# Patient Record
Sex: Male | Born: 1945 | Race: White | Hispanic: No | Marital: Married | Smoking: Former smoker
Health system: Southern US, Community
[De-identification: ages and names within clinical notes are randomized; demographics above are authoritative.]

## PROBLEM LIST (undated history)

## (undated) DIAGNOSIS — R945 Abnormal results of liver function studies: Secondary | ICD-10-CM

## (undated) DIAGNOSIS — E1140 Type 2 diabetes mellitus with diabetic neuropathy, unspecified: Secondary | ICD-10-CM

## (undated) DIAGNOSIS — Z947 Corneal transplant status: Secondary | ICD-10-CM

## (undated) DIAGNOSIS — C439 Malignant melanoma of skin, unspecified: Secondary | ICD-10-CM

## (undated) DIAGNOSIS — N529 Male erectile dysfunction, unspecified: Secondary | ICD-10-CM

## (undated) DIAGNOSIS — E119 Type 2 diabetes mellitus without complications: Secondary | ICD-10-CM

## (undated) DIAGNOSIS — I5032 Chronic diastolic (congestive) heart failure: Secondary | ICD-10-CM

## (undated) DIAGNOSIS — E291 Testicular hypofunction: Secondary | ICD-10-CM

## (undated) DIAGNOSIS — F32A Depression, unspecified: Secondary | ICD-10-CM

## (undated) DIAGNOSIS — G319 Degenerative disease of nervous system, unspecified: Secondary | ICD-10-CM

## (undated) DIAGNOSIS — E785 Hyperlipidemia, unspecified: Secondary | ICD-10-CM

## (undated) DIAGNOSIS — G4730 Sleep apnea, unspecified: Secondary | ICD-10-CM

## (undated) DIAGNOSIS — I10 Essential (primary) hypertension: Secondary | ICD-10-CM

## (undated) DIAGNOSIS — C801 Malignant (primary) neoplasm, unspecified: Secondary | ICD-10-CM

## (undated) DIAGNOSIS — I700 Atherosclerosis of aorta: Secondary | ICD-10-CM

## (undated) DIAGNOSIS — N401 Enlarged prostate with lower urinary tract symptoms: Secondary | ICD-10-CM

## (undated) DIAGNOSIS — N1831 Chronic kidney disease, stage 3a: Secondary | ICD-10-CM

## (undated) DIAGNOSIS — I831 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: Secondary | ICD-10-CM

## (undated) DIAGNOSIS — R413 Other amnesia: Secondary | ICD-10-CM

## (undated) DIAGNOSIS — M1990 Unspecified osteoarthritis, unspecified site: Secondary | ICD-10-CM

## (undated) DIAGNOSIS — I6523 Occlusion and stenosis of bilateral carotid arteries: Secondary | ICD-10-CM

## (undated) HISTORY — DX: Occlusion and stenosis of bilateral carotid arteries: I65.23

## (undated) HISTORY — DX: Type 2 diabetes mellitus with diabetic neuropathy, unspecified: E11.40

## (undated) HISTORY — DX: Benign prostatic hyperplasia with lower urinary tract symptoms: N40.1

## (undated) HISTORY — DX: Male erectile dysfunction, unspecified: N52.9

## (undated) HISTORY — DX: Atherosclerosis of aorta: I70.0

## (undated) HISTORY — DX: Hyperlipidemia, unspecified: E78.5

## (undated) HISTORY — DX: Chronic diastolic (congestive) heart failure: I50.32

## (undated) HISTORY — DX: Malignant melanoma of skin, unspecified: C43.9

## (undated) HISTORY — DX: Morbid (severe) obesity due to excess calories: E66.01

## (undated) HISTORY — DX: Chronic kidney disease, stage 3a: N18.31

## (undated) HISTORY — DX: Abnormal results of liver function studies: R94.5

## (undated) HISTORY — DX: Depression, unspecified: F32.A

## (undated) HISTORY — DX: Other amnesia: R41.3

## (undated) HISTORY — DX: Testicular hypofunction: E29.1

## (undated) HISTORY — DX: Sleep apnea, unspecified: G47.30

## (undated) HISTORY — DX: Essential (primary) hypertension: I10

## (undated) HISTORY — DX: 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: I83.10

## (undated) HISTORY — PX: OTHER SURGICAL HISTORY: SHX169

## (undated) HISTORY — DX: Corneal transplant status: Z94.7

## (undated) HISTORY — DX: Degenerative disease of nervous system, unspecified: G31.9

---

## 2012-05-03 DIAGNOSIS — N401 Enlarged prostate with lower urinary tract symptoms: Secondary | ICD-10-CM

## 2012-05-03 HISTORY — DX: Benign prostatic hyperplasia with lower urinary tract symptoms: N40.1

## 2012-05-17 ENCOUNTER — Other Ambulatory Visit (HOSPITAL_COMMUNITY): Payer: Self-pay

## 2012-05-18 ENCOUNTER — Other Ambulatory Visit (HOSPITAL_COMMUNITY): Payer: Self-pay

## 2012-05-25 ENCOUNTER — Ambulatory Visit (HOSPITAL_COMMUNITY): Payer: Medicare HMO | Attending: Cardiology | Admitting: Radiology

## 2012-05-25 ENCOUNTER — Other Ambulatory Visit (HOSPITAL_COMMUNITY): Payer: Self-pay | Admitting: Endocrinology

## 2012-05-25 DIAGNOSIS — E119 Type 2 diabetes mellitus without complications: Secondary | ICD-10-CM | POA: Insufficient documentation

## 2012-05-25 DIAGNOSIS — R609 Edema, unspecified: Secondary | ICD-10-CM

## 2012-05-25 DIAGNOSIS — I10 Essential (primary) hypertension: Secondary | ICD-10-CM | POA: Insufficient documentation

## 2012-05-25 NOTE — Progress Notes (Signed)
Echocardiogram performed.  

## 2012-05-26 ENCOUNTER — Encounter (HOSPITAL_COMMUNITY): Payer: Self-pay | Admitting: Endocrinology

## 2014-07-24 ENCOUNTER — Other Ambulatory Visit: Payer: Self-pay | Admitting: Gastroenterology

## 2014-07-24 DIAGNOSIS — E119 Type 2 diabetes mellitus without complications: Secondary | ICD-10-CM | POA: Diagnosis not present

## 2014-07-24 DIAGNOSIS — Z1211 Encounter for screening for malignant neoplasm of colon: Secondary | ICD-10-CM | POA: Diagnosis not present

## 2014-07-24 DIAGNOSIS — I10 Essential (primary) hypertension: Secondary | ICD-10-CM | POA: Diagnosis not present

## 2014-07-24 DIAGNOSIS — E6609 Other obesity due to excess calories: Secondary | ICD-10-CM | POA: Diagnosis not present

## 2014-07-24 DIAGNOSIS — R740 Nonspecific elevation of levels of transaminase and lactic acid dehydrogenase [LDH]: Secondary | ICD-10-CM | POA: Diagnosis not present

## 2014-07-26 ENCOUNTER — Other Ambulatory Visit: Payer: Self-pay | Admitting: Gastroenterology

## 2014-07-26 DIAGNOSIS — R7989 Other specified abnormal findings of blood chemistry: Secondary | ICD-10-CM

## 2014-07-26 DIAGNOSIS — R945 Abnormal results of liver function studies: Principal | ICD-10-CM

## 2014-07-31 ENCOUNTER — Other Ambulatory Visit: Payer: Medicare HMO

## 2014-08-03 ENCOUNTER — Ambulatory Visit
Admission: RE | Admit: 2014-08-03 | Discharge: 2014-08-03 | Disposition: A | Discharge status: Home / Self Care | Payer: Commercial Managed Care - HMO | Source: Ambulatory Visit | Attending: Gastroenterology | Admitting: Gastroenterology

## 2014-08-03 DIAGNOSIS — R7989 Other specified abnormal findings of blood chemistry: Secondary | ICD-10-CM

## 2014-08-03 DIAGNOSIS — I10 Essential (primary) hypertension: Secondary | ICD-10-CM | POA: Diagnosis not present

## 2014-08-03 DIAGNOSIS — R945 Abnormal results of liver function studies: Principal | ICD-10-CM

## 2014-08-03 DIAGNOSIS — E119 Type 2 diabetes mellitus without complications: Secondary | ICD-10-CM | POA: Diagnosis not present

## 2014-08-15 DIAGNOSIS — Z1211 Encounter for screening for malignant neoplasm of colon: Secondary | ICD-10-CM | POA: Diagnosis not present

## 2014-08-15 DIAGNOSIS — K635 Polyp of colon: Secondary | ICD-10-CM | POA: Diagnosis not present

## 2014-08-15 DIAGNOSIS — K5730 Diverticulosis of large intestine without perforation or abscess without bleeding: Secondary | ICD-10-CM | POA: Diagnosis not present

## 2014-08-15 DIAGNOSIS — D123 Benign neoplasm of transverse colon: Secondary | ICD-10-CM | POA: Diagnosis not present

## 2014-08-15 DIAGNOSIS — D122 Benign neoplasm of ascending colon: Secondary | ICD-10-CM | POA: Diagnosis not present

## 2014-09-27 DIAGNOSIS — Z947 Corneal transplant status: Secondary | ICD-10-CM | POA: Diagnosis not present

## 2014-10-24 DIAGNOSIS — N401 Enlarged prostate with lower urinary tract symptoms: Secondary | ICD-10-CM | POA: Diagnosis not present

## 2014-10-24 DIAGNOSIS — E119 Type 2 diabetes mellitus without complications: Secondary | ICD-10-CM | POA: Diagnosis not present

## 2014-10-24 DIAGNOSIS — I10 Essential (primary) hypertension: Secondary | ICD-10-CM | POA: Diagnosis not present

## 2014-10-24 DIAGNOSIS — G4730 Sleep apnea, unspecified: Secondary | ICD-10-CM | POA: Diagnosis not present

## 2014-10-24 DIAGNOSIS — Z6841 Body Mass Index (BMI) 40.0 and over, adult: Secondary | ICD-10-CM | POA: Diagnosis not present

## 2014-10-24 DIAGNOSIS — E291 Testicular hypofunction: Secondary | ICD-10-CM | POA: Diagnosis not present

## 2014-10-24 DIAGNOSIS — E785 Hyperlipidemia, unspecified: Secondary | ICD-10-CM | POA: Diagnosis not present

## 2014-11-23 DIAGNOSIS — D1801 Hemangioma of skin and subcutaneous tissue: Secondary | ICD-10-CM | POA: Diagnosis not present

## 2014-11-23 DIAGNOSIS — C44319 Basal cell carcinoma of skin of other parts of face: Secondary | ICD-10-CM | POA: Diagnosis not present

## 2014-11-23 DIAGNOSIS — C4359 Malignant melanoma of other part of trunk: Secondary | ICD-10-CM | POA: Diagnosis not present

## 2014-11-23 DIAGNOSIS — C44519 Basal cell carcinoma of skin of other part of trunk: Secondary | ICD-10-CM | POA: Diagnosis not present

## 2014-11-23 DIAGNOSIS — D485 Neoplasm of uncertain behavior of skin: Secondary | ICD-10-CM | POA: Diagnosis not present

## 2014-11-23 DIAGNOSIS — C4441 Basal cell carcinoma of skin of scalp and neck: Secondary | ICD-10-CM | POA: Diagnosis not present

## 2014-12-19 DIAGNOSIS — C4359 Malignant melanoma of other part of trunk: Secondary | ICD-10-CM | POA: Diagnosis not present

## 2014-12-19 DIAGNOSIS — L905 Scar conditions and fibrosis of skin: Secondary | ICD-10-CM | POA: Diagnosis not present

## 2014-12-19 DIAGNOSIS — Z85820 Personal history of malignant melanoma of skin: Secondary | ICD-10-CM | POA: Diagnosis not present

## 2015-01-02 DIAGNOSIS — Z85828 Personal history of other malignant neoplasm of skin: Secondary | ICD-10-CM | POA: Diagnosis not present

## 2015-01-02 DIAGNOSIS — C44319 Basal cell carcinoma of skin of other parts of face: Secondary | ICD-10-CM | POA: Diagnosis not present

## 2015-01-02 DIAGNOSIS — Z85820 Personal history of malignant melanoma of skin: Secondary | ICD-10-CM | POA: Diagnosis not present

## 2015-01-09 DIAGNOSIS — Z85820 Personal history of malignant melanoma of skin: Secondary | ICD-10-CM | POA: Diagnosis not present

## 2015-01-09 DIAGNOSIS — C4441 Basal cell carcinoma of skin of scalp and neck: Secondary | ICD-10-CM | POA: Diagnosis not present

## 2015-01-09 DIAGNOSIS — C44519 Basal cell carcinoma of skin of other part of trunk: Secondary | ICD-10-CM | POA: Diagnosis not present

## 2015-01-09 DIAGNOSIS — Z85828 Personal history of other malignant neoplasm of skin: Secondary | ICD-10-CM | POA: Diagnosis not present

## 2015-01-09 DIAGNOSIS — L0889 Other specified local infections of the skin and subcutaneous tissue: Secondary | ICD-10-CM | POA: Diagnosis not present

## 2015-01-26 DIAGNOSIS — Z947 Corneal transplant status: Secondary | ICD-10-CM | POA: Diagnosis not present

## 2015-01-26 DIAGNOSIS — Z961 Presence of intraocular lens: Secondary | ICD-10-CM | POA: Diagnosis not present

## 2015-02-26 DIAGNOSIS — N401 Enlarged prostate with lower urinary tract symptoms: Secondary | ICD-10-CM | POA: Diagnosis not present

## 2015-02-26 DIAGNOSIS — Z947 Corneal transplant status: Secondary | ICD-10-CM | POA: Diagnosis not present

## 2015-02-26 DIAGNOSIS — I10 Essential (primary) hypertension: Secondary | ICD-10-CM | POA: Diagnosis not present

## 2015-02-26 DIAGNOSIS — E785 Hyperlipidemia, unspecified: Secondary | ICD-10-CM | POA: Diagnosis not present

## 2015-02-26 DIAGNOSIS — Z6841 Body Mass Index (BMI) 40.0 and over, adult: Secondary | ICD-10-CM | POA: Diagnosis not present

## 2015-02-26 DIAGNOSIS — E119 Type 2 diabetes mellitus without complications: Secondary | ICD-10-CM | POA: Diagnosis not present

## 2015-02-26 DIAGNOSIS — G4730 Sleep apnea, unspecified: Secondary | ICD-10-CM | POA: Diagnosis not present

## 2015-02-26 DIAGNOSIS — Z1389 Encounter for screening for other disorder: Secondary | ICD-10-CM | POA: Diagnosis not present

## 2015-04-10 DIAGNOSIS — L570 Actinic keratosis: Secondary | ICD-10-CM | POA: Diagnosis not present

## 2015-04-10 DIAGNOSIS — L918 Other hypertrophic disorders of the skin: Secondary | ICD-10-CM | POA: Diagnosis not present

## 2015-04-10 DIAGNOSIS — Z85820 Personal history of malignant melanoma of skin: Secondary | ICD-10-CM | POA: Diagnosis not present

## 2015-04-10 DIAGNOSIS — D3617 Benign neoplasm of peripheral nerves and autonomic nervous system of trunk, unspecified: Secondary | ICD-10-CM | POA: Diagnosis not present

## 2015-04-10 DIAGNOSIS — L821 Other seborrheic keratosis: Secondary | ICD-10-CM | POA: Diagnosis not present

## 2015-06-26 DIAGNOSIS — E291 Testicular hypofunction: Secondary | ICD-10-CM | POA: Diagnosis not present

## 2015-06-26 DIAGNOSIS — N401 Enlarged prostate with lower urinary tract symptoms: Secondary | ICD-10-CM | POA: Diagnosis not present

## 2015-06-26 DIAGNOSIS — R945 Abnormal results of liver function studies: Secondary | ICD-10-CM | POA: Diagnosis not present

## 2015-06-26 DIAGNOSIS — E784 Other hyperlipidemia: Secondary | ICD-10-CM | POA: Diagnosis not present

## 2015-06-26 DIAGNOSIS — R000 Tachycardia, unspecified: Secondary | ICD-10-CM | POA: Diagnosis not present

## 2015-06-26 DIAGNOSIS — G4739 Other sleep apnea: Secondary | ICD-10-CM | POA: Diagnosis not present

## 2015-06-26 DIAGNOSIS — Z947 Corneal transplant status: Secondary | ICD-10-CM | POA: Diagnosis not present

## 2015-06-26 DIAGNOSIS — E119 Type 2 diabetes mellitus without complications: Secondary | ICD-10-CM | POA: Diagnosis not present

## 2015-06-26 DIAGNOSIS — I10 Essential (primary) hypertension: Secondary | ICD-10-CM | POA: Diagnosis not present

## 2015-11-14 DIAGNOSIS — G4739 Other sleep apnea: Secondary | ICD-10-CM | POA: Diagnosis not present

## 2015-11-14 DIAGNOSIS — N401 Enlarged prostate with lower urinary tract symptoms: Secondary | ICD-10-CM | POA: Diagnosis not present

## 2015-11-14 DIAGNOSIS — Z6841 Body Mass Index (BMI) 40.0 and over, adult: Secondary | ICD-10-CM | POA: Diagnosis not present

## 2015-11-14 DIAGNOSIS — E784 Other hyperlipidemia: Secondary | ICD-10-CM | POA: Diagnosis not present

## 2015-11-14 DIAGNOSIS — E119 Type 2 diabetes mellitus without complications: Secondary | ICD-10-CM | POA: Diagnosis not present

## 2015-11-14 DIAGNOSIS — Z1389 Encounter for screening for other disorder: Secondary | ICD-10-CM | POA: Diagnosis not present

## 2015-11-14 DIAGNOSIS — I10 Essential (primary) hypertension: Secondary | ICD-10-CM | POA: Diagnosis not present

## 2015-11-14 DIAGNOSIS — R945 Abnormal results of liver function studies: Secondary | ICD-10-CM | POA: Diagnosis not present

## 2016-02-12 IMAGING — US US ABDOMEN COMPLETE
1 series · 14 of 25 positions shown · non-contrast
Comparison: None.

CLINICAL DATA: Elevated liver function studies; history of diabetes
and hypertension

EXAM:
ULTRASOUND ABDOMEN COMPLETE

[Series 1: us abdomen complete · 0.50mm/px · 14 of 69 slices shown]
[im 1/69]
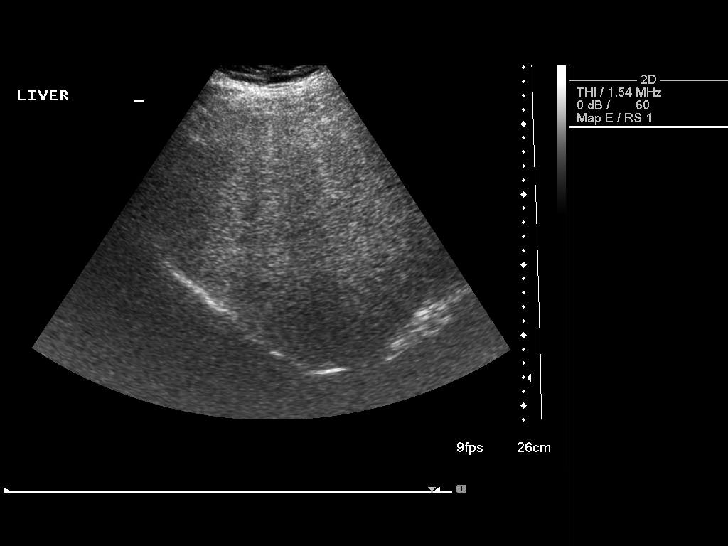
[im 6/69]
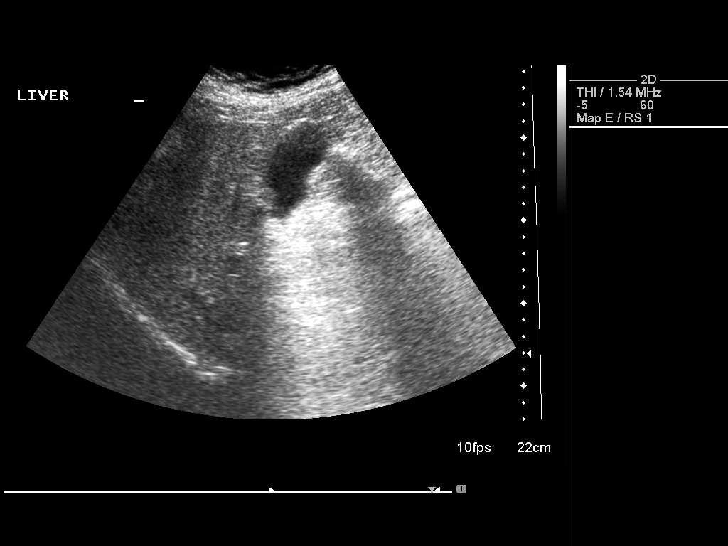
[im 12/69]
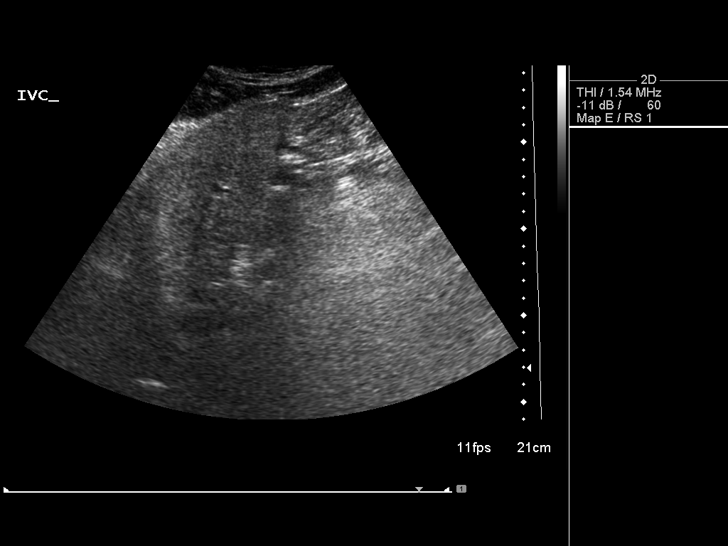
[im 18/69]
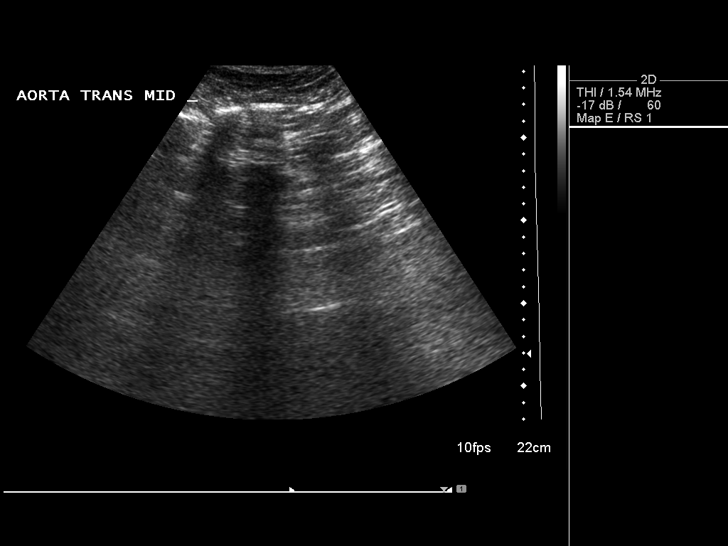
[im 23/69]
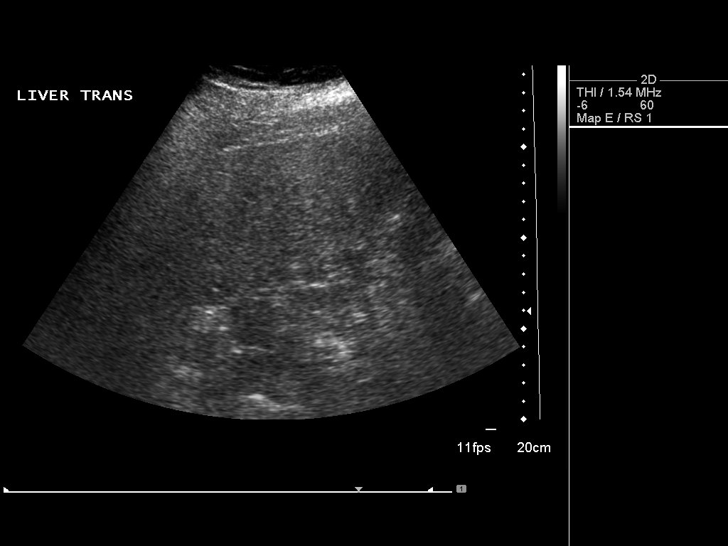
[im 26/69]
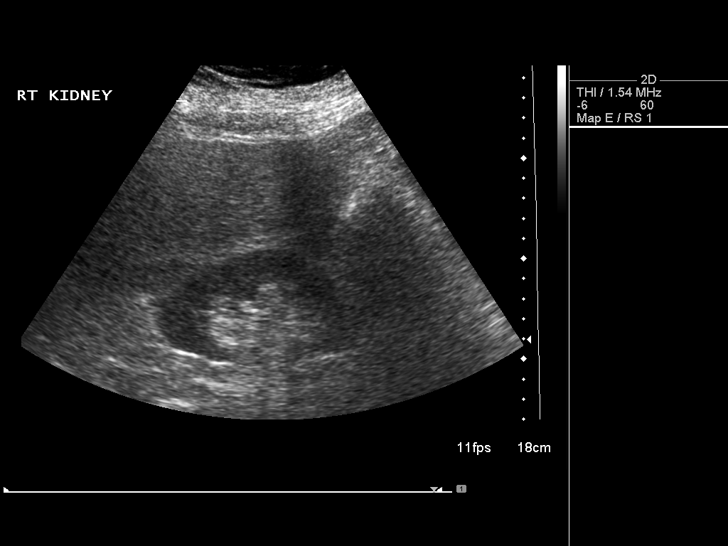
[im 32/69]
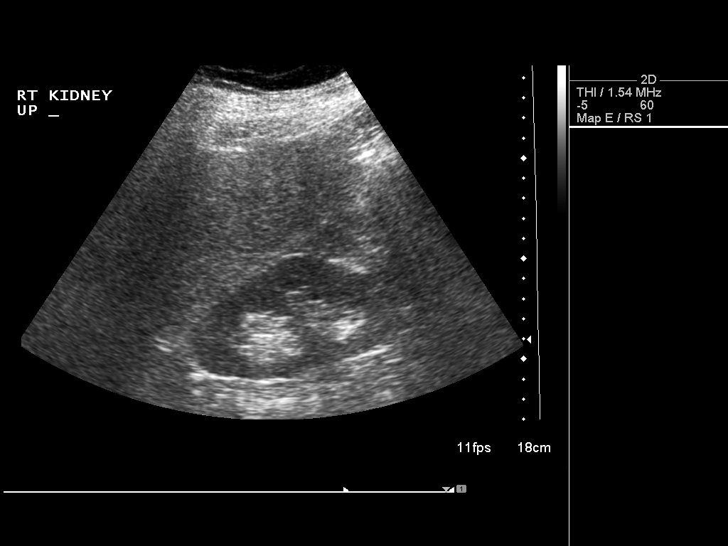
[im 37/69]
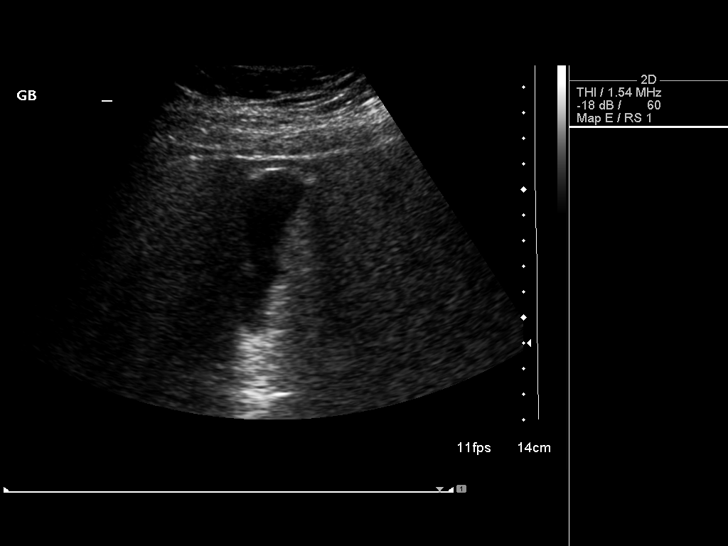
[im 43/69]
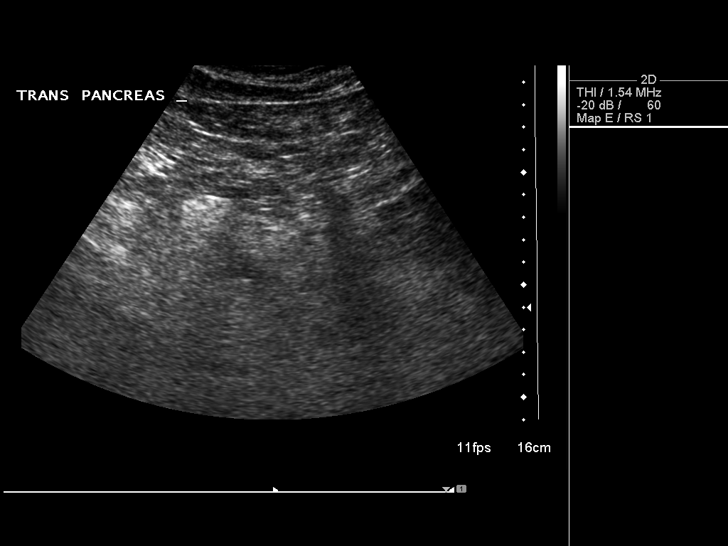
[im 46/69]
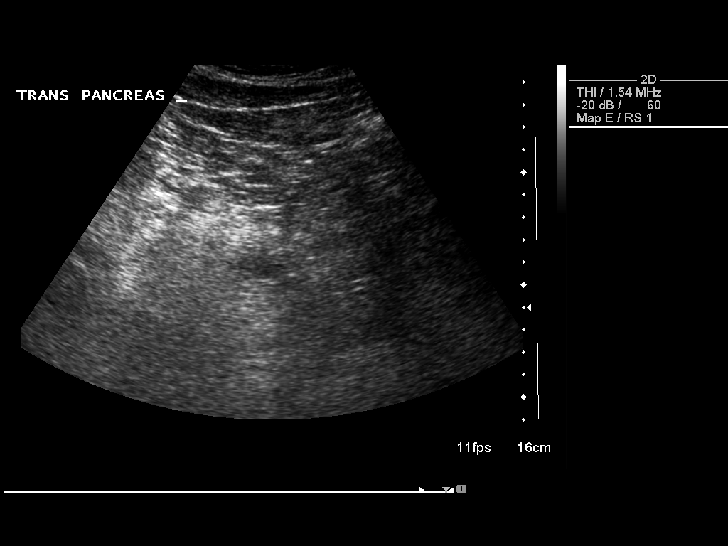
[im 52/69]
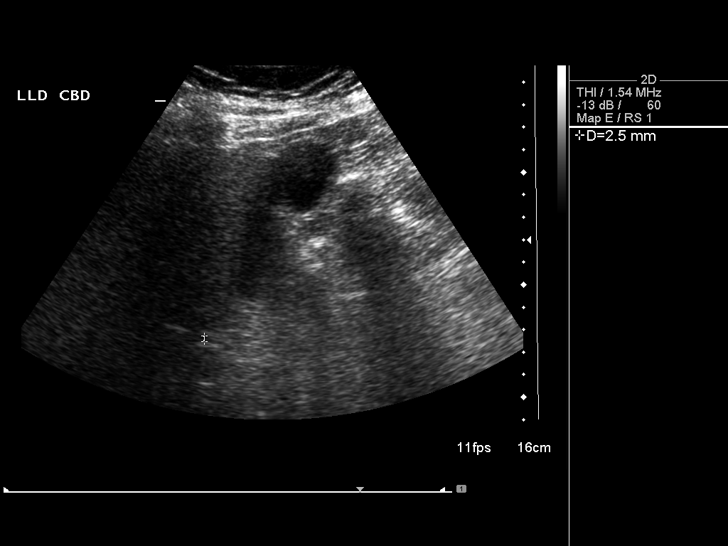
[im 57/69]
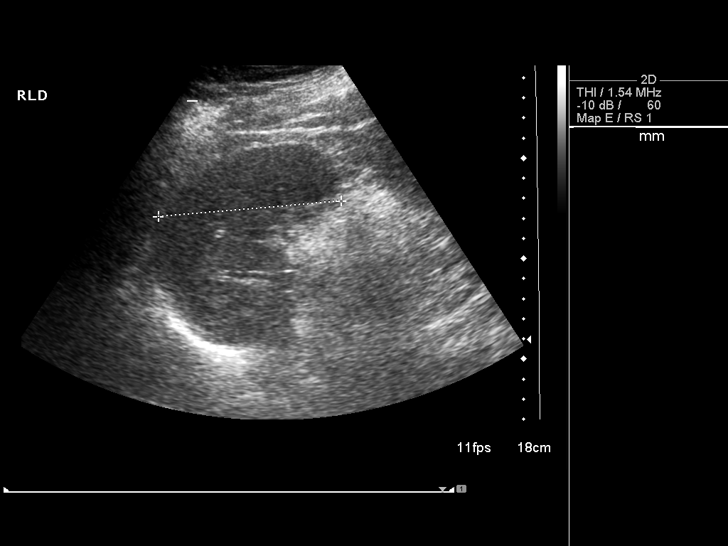
[im 63/69]
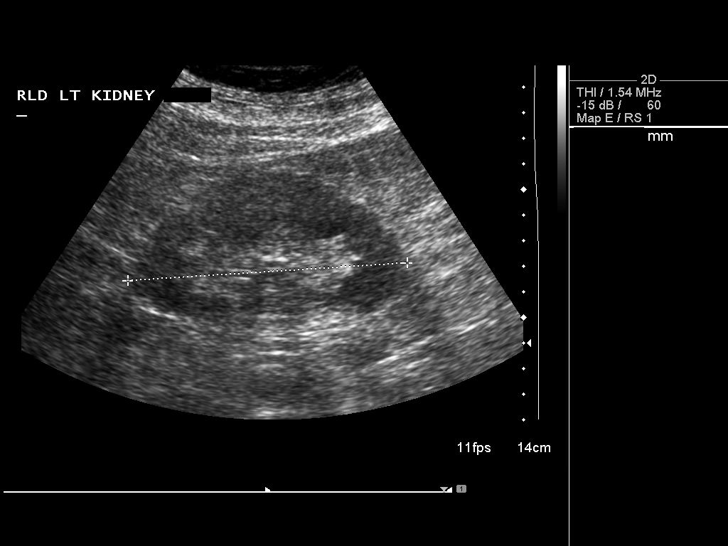
[im 69/69]
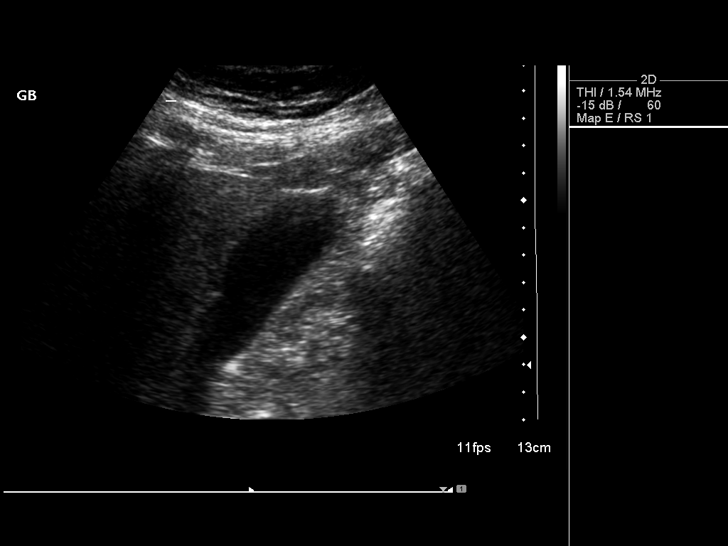

[14 of 25 positions shown; findings below may reference images not displayed]

FINDINGS: Gallbladder: No gallstones or wall thickening visualized. No
sonographic Murphy sign noted.

Common bile duct: Diameter: 3.8 mm

Liver: The hepatic echotexture is increased. There is no focal mass
or ductal dilation.

IVC: Bowel gas limits evaluation of the inferior vena cava.

Pancreas: Bowel gas limits evaluation of the pancreas.

Spleen: Size and appearance within normal limits.

Right Kidney: Length: 11.5 cm. Echogenicity within normal limits. No
mass or hydronephrosis visualized.

Left Kidney: Length: 11.0 cm. Echogenicity within normal limits. No
mass or hydronephrosis visualized.

Abdominal aorta: The abdominal aorta was largely obscured by bowel
gas.

Other findings: No ascites demonstrated.
IMPRESSION: The study is limited due to the patient's body habitus and bowel
gas. Increased hepatic echotexture is consistent with fatty
infiltrative change. No acute abnormality is demonstrated elsewhere.

## 2016-03-20 DIAGNOSIS — I10 Essential (primary) hypertension: Secondary | ICD-10-CM | POA: Diagnosis not present

## 2016-03-20 DIAGNOSIS — G4739 Other sleep apnea: Secondary | ICD-10-CM | POA: Diagnosis not present

## 2016-03-20 DIAGNOSIS — E784 Other hyperlipidemia: Secondary | ICD-10-CM | POA: Diagnosis not present

## 2016-03-20 DIAGNOSIS — N401 Enlarged prostate with lower urinary tract symptoms: Secondary | ICD-10-CM | POA: Diagnosis not present

## 2016-03-20 DIAGNOSIS — Z6841 Body Mass Index (BMI) 40.0 and over, adult: Secondary | ICD-10-CM | POA: Diagnosis not present

## 2016-03-20 DIAGNOSIS — Z947 Corneal transplant status: Secondary | ICD-10-CM | POA: Diagnosis not present

## 2016-03-20 DIAGNOSIS — E119 Type 2 diabetes mellitus without complications: Secondary | ICD-10-CM | POA: Diagnosis not present

## 2016-07-23 DIAGNOSIS — Z1389 Encounter for screening for other disorder: Secondary | ICD-10-CM | POA: Diagnosis not present

## 2016-07-23 DIAGNOSIS — Z947 Corneal transplant status: Secondary | ICD-10-CM | POA: Diagnosis not present

## 2016-07-23 DIAGNOSIS — I10 Essential (primary) hypertension: Secondary | ICD-10-CM | POA: Diagnosis not present

## 2016-07-23 DIAGNOSIS — G4739 Other sleep apnea: Secondary | ICD-10-CM | POA: Diagnosis not present

## 2016-07-23 DIAGNOSIS — E784 Other hyperlipidemia: Secondary | ICD-10-CM | POA: Diagnosis not present

## 2016-07-23 DIAGNOSIS — Z6841 Body Mass Index (BMI) 40.0 and over, adult: Secondary | ICD-10-CM | POA: Diagnosis not present

## 2016-07-23 DIAGNOSIS — E119 Type 2 diabetes mellitus without complications: Secondary | ICD-10-CM | POA: Diagnosis not present

## 2016-07-23 DIAGNOSIS — R945 Abnormal results of liver function studies: Secondary | ICD-10-CM | POA: Diagnosis not present

## 2016-07-23 DIAGNOSIS — N401 Enlarged prostate with lower urinary tract symptoms: Secondary | ICD-10-CM | POA: Diagnosis not present

## 2016-11-26 ENCOUNTER — Other Ambulatory Visit: Payer: Self-pay | Admitting: Endocrinology

## 2016-11-26 DIAGNOSIS — Z1389 Encounter for screening for other disorder: Secondary | ICD-10-CM | POA: Diagnosis not present

## 2016-11-26 DIAGNOSIS — E1142 Type 2 diabetes mellitus with diabetic polyneuropathy: Secondary | ICD-10-CM | POA: Diagnosis not present

## 2016-11-26 DIAGNOSIS — E784 Other hyperlipidemia: Secondary | ICD-10-CM | POA: Diagnosis not present

## 2016-11-26 DIAGNOSIS — R109 Unspecified abdominal pain: Secondary | ICD-10-CM

## 2016-11-26 DIAGNOSIS — N401 Enlarged prostate with lower urinary tract symptoms: Secondary | ICD-10-CM | POA: Diagnosis not present

## 2016-11-26 DIAGNOSIS — R945 Abnormal results of liver function studies: Secondary | ICD-10-CM | POA: Diagnosis not present

## 2016-11-26 DIAGNOSIS — E119 Type 2 diabetes mellitus without complications: Secondary | ICD-10-CM | POA: Diagnosis not present

## 2016-11-26 DIAGNOSIS — M1990 Unspecified osteoarthritis, unspecified site: Secondary | ICD-10-CM | POA: Diagnosis not present

## 2016-11-26 DIAGNOSIS — I10 Essential (primary) hypertension: Secondary | ICD-10-CM | POA: Diagnosis not present

## 2016-11-26 DIAGNOSIS — G4730 Sleep apnea, unspecified: Secondary | ICD-10-CM | POA: Diagnosis not present

## 2016-11-26 DIAGNOSIS — R7989 Other specified abnormal findings of blood chemistry: Secondary | ICD-10-CM

## 2016-11-28 ENCOUNTER — Ambulatory Visit
Admission: RE | Admit: 2016-11-28 | Discharge: 2016-11-28 | Disposition: A | Discharge status: Home / Self Care | Payer: Commercial Managed Care - HMO | Source: Ambulatory Visit | Attending: Endocrinology | Admitting: Endocrinology

## 2016-11-28 DIAGNOSIS — R7989 Other specified abnormal findings of blood chemistry: Secondary | ICD-10-CM

## 2016-11-28 DIAGNOSIS — R945 Abnormal results of liver function studies: Secondary | ICD-10-CM

## 2016-11-28 DIAGNOSIS — R109 Unspecified abdominal pain: Secondary | ICD-10-CM

## 2016-11-28 MED ORDER — IOPAMIDOL (ISOVUE-300) INJECTION 61%
100.0000 mL | Freq: Once | INTRAVENOUS | Status: AC | PRN
  Administered 2016-11-28: 100 mL via INTRAVENOUS

## 2017-01-29 DIAGNOSIS — E1140 Type 2 diabetes mellitus with diabetic neuropathy, unspecified: Secondary | ICD-10-CM | POA: Diagnosis not present

## 2017-01-29 DIAGNOSIS — I10 Essential (primary) hypertension: Secondary | ICD-10-CM | POA: Diagnosis not present

## 2017-02-04 DIAGNOSIS — Z0389 Encounter for observation for other suspected diseases and conditions ruled out: Secondary | ICD-10-CM | POA: Diagnosis not present

## 2017-02-04 DIAGNOSIS — Z961 Presence of intraocular lens: Secondary | ICD-10-CM | POA: Diagnosis not present

## 2017-02-04 DIAGNOSIS — Z947 Corneal transplant status: Secondary | ICD-10-CM | POA: Diagnosis not present

## 2017-03-09 DIAGNOSIS — R945 Abnormal results of liver function studies: Secondary | ICD-10-CM | POA: Diagnosis not present

## 2017-03-09 DIAGNOSIS — I10 Essential (primary) hypertension: Secondary | ICD-10-CM | POA: Diagnosis not present

## 2017-03-09 DIAGNOSIS — E1149 Type 2 diabetes mellitus with other diabetic neurological complication: Secondary | ICD-10-CM | POA: Diagnosis not present

## 2017-03-09 DIAGNOSIS — Z6841 Body Mass Index (BMI) 40.0 and over, adult: Secondary | ICD-10-CM | POA: Diagnosis not present

## 2017-03-09 DIAGNOSIS — J984 Other disorders of lung: Secondary | ICD-10-CM | POA: Diagnosis not present

## 2017-03-09 DIAGNOSIS — Z947 Corneal transplant status: Secondary | ICD-10-CM | POA: Diagnosis not present

## 2017-03-09 DIAGNOSIS — N401 Enlarged prostate with lower urinary tract symptoms: Secondary | ICD-10-CM | POA: Diagnosis not present

## 2017-03-09 DIAGNOSIS — E1142 Type 2 diabetes mellitus with diabetic polyneuropathy: Secondary | ICD-10-CM | POA: Diagnosis not present

## 2017-03-09 DIAGNOSIS — E784 Other hyperlipidemia: Secondary | ICD-10-CM | POA: Diagnosis not present

## 2017-07-24 DIAGNOSIS — E11311 Type 2 diabetes mellitus with unspecified diabetic retinopathy with macular edema: Secondary | ICD-10-CM | POA: Diagnosis not present

## 2017-07-24 DIAGNOSIS — E1142 Type 2 diabetes mellitus with diabetic polyneuropathy: Secondary | ICD-10-CM | POA: Diagnosis not present

## 2017-07-24 DIAGNOSIS — N401 Enlarged prostate with lower urinary tract symptoms: Secondary | ICD-10-CM | POA: Diagnosis not present

## 2017-07-24 DIAGNOSIS — R740 Nonspecific elevation of levels of transaminase and lactic acid dehydrogenase [LDH]: Secondary | ICD-10-CM | POA: Diagnosis not present

## 2017-07-24 DIAGNOSIS — Z1389 Encounter for screening for other disorder: Secondary | ICD-10-CM | POA: Diagnosis not present

## 2017-07-24 DIAGNOSIS — E7849 Other hyperlipidemia: Secondary | ICD-10-CM | POA: Diagnosis not present

## 2017-07-24 DIAGNOSIS — J984 Other disorders of lung: Secondary | ICD-10-CM | POA: Diagnosis not present

## 2017-07-24 DIAGNOSIS — I10 Essential (primary) hypertension: Secondary | ICD-10-CM | POA: Diagnosis not present

## 2017-07-24 DIAGNOSIS — E1149 Type 2 diabetes mellitus with other diabetic neurological complication: Secondary | ICD-10-CM | POA: Diagnosis not present

## 2017-07-24 DIAGNOSIS — Z794 Long term (current) use of insulin: Secondary | ICD-10-CM

## 2017-07-24 HISTORY — DX: Long term (current) use of insulin: Z79.4

## 2017-11-23 DIAGNOSIS — I10 Essential (primary) hypertension: Secondary | ICD-10-CM | POA: Diagnosis not present

## 2017-11-23 DIAGNOSIS — G4730 Sleep apnea, unspecified: Secondary | ICD-10-CM | POA: Diagnosis not present

## 2017-11-23 DIAGNOSIS — R740 Nonspecific elevation of levels of transaminase and lactic acid dehydrogenase [LDH]: Secondary | ICD-10-CM | POA: Diagnosis not present

## 2017-11-23 DIAGNOSIS — E7849 Other hyperlipidemia: Secondary | ICD-10-CM | POA: Diagnosis not present

## 2017-11-23 DIAGNOSIS — E1142 Type 2 diabetes mellitus with diabetic polyneuropathy: Secondary | ICD-10-CM | POA: Diagnosis not present

## 2017-11-23 DIAGNOSIS — N401 Enlarged prostate with lower urinary tract symptoms: Secondary | ICD-10-CM | POA: Diagnosis not present

## 2017-11-23 DIAGNOSIS — Z794 Long term (current) use of insulin: Secondary | ICD-10-CM | POA: Diagnosis not present

## 2017-11-23 DIAGNOSIS — E11311 Type 2 diabetes mellitus with unspecified diabetic retinopathy with macular edema: Secondary | ICD-10-CM | POA: Diagnosis not present

## 2017-11-23 DIAGNOSIS — E1149 Type 2 diabetes mellitus with other diabetic neurological complication: Secondary | ICD-10-CM | POA: Diagnosis not present

## 2017-12-02 DIAGNOSIS — H35372 Puckering of macula, left eye: Secondary | ICD-10-CM | POA: Diagnosis not present

## 2017-12-02 DIAGNOSIS — Z961 Presence of intraocular lens: Secondary | ICD-10-CM | POA: Diagnosis not present

## 2017-12-02 DIAGNOSIS — Z947 Corneal transplant status: Secondary | ICD-10-CM | POA: Diagnosis not present

## 2018-01-05 DIAGNOSIS — Z947 Corneal transplant status: Secondary | ICD-10-CM | POA: Diagnosis not present

## 2018-01-05 DIAGNOSIS — E119 Type 2 diabetes mellitus without complications: Secondary | ICD-10-CM | POA: Diagnosis not present

## 2018-01-05 DIAGNOSIS — H538 Other visual disturbances: Secondary | ICD-10-CM | POA: Diagnosis not present

## 2018-01-05 DIAGNOSIS — H35372 Puckering of macula, left eye: Secondary | ICD-10-CM | POA: Diagnosis not present

## 2018-01-05 DIAGNOSIS — Z0389 Encounter for observation for other suspected diseases and conditions ruled out: Secondary | ICD-10-CM | POA: Diagnosis not present

## 2018-01-05 DIAGNOSIS — Z7984 Long term (current) use of oral hypoglycemic drugs: Secondary | ICD-10-CM | POA: Diagnosis not present

## 2018-01-05 DIAGNOSIS — Z794 Long term (current) use of insulin: Secondary | ICD-10-CM | POA: Diagnosis not present

## 2018-01-05 DIAGNOSIS — Z961 Presence of intraocular lens: Secondary | ICD-10-CM | POA: Diagnosis not present

## 2018-03-23 DIAGNOSIS — N401 Enlarged prostate with lower urinary tract symptoms: Secondary | ICD-10-CM | POA: Diagnosis not present

## 2018-03-23 DIAGNOSIS — R000 Tachycardia, unspecified: Secondary | ICD-10-CM | POA: Diagnosis not present

## 2018-03-23 DIAGNOSIS — E1142 Type 2 diabetes mellitus with diabetic polyneuropathy: Secondary | ICD-10-CM | POA: Diagnosis not present

## 2018-03-23 DIAGNOSIS — Z794 Long term (current) use of insulin: Secondary | ICD-10-CM | POA: Diagnosis not present

## 2018-03-23 DIAGNOSIS — R740 Nonspecific elevation of levels of transaminase and lactic acid dehydrogenase [LDH]: Secondary | ICD-10-CM | POA: Diagnosis not present

## 2018-03-23 DIAGNOSIS — E7849 Other hyperlipidemia: Secondary | ICD-10-CM | POA: Diagnosis not present

## 2018-03-23 DIAGNOSIS — E11311 Type 2 diabetes mellitus with unspecified diabetic retinopathy with macular edema: Secondary | ICD-10-CM | POA: Diagnosis not present

## 2018-03-23 DIAGNOSIS — Z947 Corneal transplant status: Secondary | ICD-10-CM | POA: Diagnosis not present

## 2018-03-23 DIAGNOSIS — E1149 Type 2 diabetes mellitus with other diabetic neurological complication: Secondary | ICD-10-CM | POA: Diagnosis not present

## 2018-11-15 DIAGNOSIS — I87319 Chronic venous hypertension (idiopathic) with ulcer of unspecified lower extremity: Secondary | ICD-10-CM | POA: Diagnosis not present

## 2018-11-15 DIAGNOSIS — I87313 Chronic venous hypertension (idiopathic) with ulcer of bilateral lower extremity: Secondary | ICD-10-CM | POA: Diagnosis not present

## 2018-11-15 DIAGNOSIS — E119 Type 2 diabetes mellitus without complications: Secondary | ICD-10-CM | POA: Diagnosis not present

## 2018-11-15 DIAGNOSIS — L03119 Cellulitis of unspecified part of limb: Secondary | ICD-10-CM | POA: Diagnosis not present

## 2018-11-29 DIAGNOSIS — E1140 Type 2 diabetes mellitus with diabetic neuropathy, unspecified: Secondary | ICD-10-CM | POA: Diagnosis not present

## 2018-11-29 DIAGNOSIS — E11311 Type 2 diabetes mellitus with unspecified diabetic retinopathy with macular edema: Secondary | ICD-10-CM | POA: Diagnosis not present

## 2018-11-29 DIAGNOSIS — Z947 Corneal transplant status: Secondary | ICD-10-CM | POA: Diagnosis not present

## 2018-11-29 DIAGNOSIS — I10 Essential (primary) hypertension: Secondary | ICD-10-CM | POA: Diagnosis not present

## 2018-11-29 DIAGNOSIS — Z794 Long term (current) use of insulin: Secondary | ICD-10-CM | POA: Diagnosis not present

## 2018-11-29 DIAGNOSIS — R740 Nonspecific elevation of levels of transaminase and lactic acid dehydrogenase [LDH]: Secondary | ICD-10-CM | POA: Diagnosis not present

## 2018-11-29 DIAGNOSIS — E1142 Type 2 diabetes mellitus with diabetic polyneuropathy: Secondary | ICD-10-CM | POA: Diagnosis not present

## 2018-11-29 DIAGNOSIS — I831 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: Secondary | ICD-10-CM | POA: Diagnosis not present

## 2018-11-29 DIAGNOSIS — G4730 Sleep apnea, unspecified: Secondary | ICD-10-CM | POA: Diagnosis not present

## 2018-11-29 DIAGNOSIS — N401 Enlarged prostate with lower urinary tract symptoms: Secondary | ICD-10-CM | POA: Diagnosis not present

## 2019-01-03 DIAGNOSIS — E1149 Type 2 diabetes mellitus with other diabetic neurological complication: Secondary | ICD-10-CM | POA: Diagnosis not present

## 2019-04-08 DIAGNOSIS — R740 Nonspecific elevation of levels of transaminase and lactic acid dehydrogenase [LDH]: Secondary | ICD-10-CM | POA: Diagnosis not present

## 2019-04-08 DIAGNOSIS — I10 Essential (primary) hypertension: Secondary | ICD-10-CM | POA: Diagnosis not present

## 2019-04-08 DIAGNOSIS — E1122 Type 2 diabetes mellitus with diabetic chronic kidney disease: Secondary | ICD-10-CM | POA: Diagnosis not present

## 2019-04-08 DIAGNOSIS — E1142 Type 2 diabetes mellitus with diabetic polyneuropathy: Secondary | ICD-10-CM | POA: Diagnosis not present

## 2019-04-08 DIAGNOSIS — J984 Other disorders of lung: Secondary | ICD-10-CM | POA: Diagnosis not present

## 2019-04-08 DIAGNOSIS — Z6841 Body Mass Index (BMI) 40.0 and over, adult: Secondary | ICD-10-CM | POA: Diagnosis not present

## 2019-04-08 DIAGNOSIS — Z794 Long term (current) use of insulin: Secondary | ICD-10-CM | POA: Diagnosis not present

## 2019-04-08 DIAGNOSIS — Z1331 Encounter for screening for depression: Secondary | ICD-10-CM | POA: Diagnosis not present

## 2019-04-08 DIAGNOSIS — G4730 Sleep apnea, unspecified: Secondary | ICD-10-CM | POA: Diagnosis not present

## 2019-04-08 DIAGNOSIS — E1149 Type 2 diabetes mellitus with other diabetic neurological complication: Secondary | ICD-10-CM | POA: Diagnosis not present

## 2019-04-08 DIAGNOSIS — E11311 Type 2 diabetes mellitus with unspecified diabetic retinopathy with macular edema: Secondary | ICD-10-CM | POA: Diagnosis not present

## 2019-04-08 DIAGNOSIS — N183 Chronic kidney disease, stage 3 (moderate): Secondary | ICD-10-CM | POA: Diagnosis not present

## 2019-04-08 DIAGNOSIS — I700 Atherosclerosis of aorta: Secondary | ICD-10-CM | POA: Diagnosis not present

## 2019-04-08 DIAGNOSIS — E785 Hyperlipidemia, unspecified: Secondary | ICD-10-CM | POA: Diagnosis not present

## 2019-08-08 DIAGNOSIS — I129 Hypertensive chronic kidney disease with stage 1 through stage 4 chronic kidney disease, or unspecified chronic kidney disease: Secondary | ICD-10-CM | POA: Insufficient documentation

## 2019-08-08 DIAGNOSIS — E785 Hyperlipidemia, unspecified: Secondary | ICD-10-CM | POA: Diagnosis not present

## 2019-08-08 DIAGNOSIS — N1831 Chronic kidney disease, stage 3a: Secondary | ICD-10-CM | POA: Diagnosis not present

## 2019-08-08 DIAGNOSIS — I831 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: Secondary | ICD-10-CM | POA: Diagnosis not present

## 2019-08-08 DIAGNOSIS — J984 Other disorders of lung: Secondary | ICD-10-CM | POA: Diagnosis not present

## 2019-08-08 DIAGNOSIS — E1149 Type 2 diabetes mellitus with other diabetic neurological complication: Secondary | ICD-10-CM | POA: Diagnosis not present

## 2019-08-08 DIAGNOSIS — Z794 Long term (current) use of insulin: Secondary | ICD-10-CM | POA: Diagnosis not present

## 2019-08-08 DIAGNOSIS — I700 Atherosclerosis of aorta: Secondary | ICD-10-CM | POA: Diagnosis not present

## 2019-08-08 HISTORY — DX: Hypertensive chronic kidney disease with stage 1 through stage 4 chronic kidney disease, or unspecified chronic kidney disease: I12.9

## 2019-12-12 DIAGNOSIS — N401 Enlarged prostate with lower urinary tract symptoms: Secondary | ICD-10-CM | POA: Diagnosis not present

## 2019-12-12 DIAGNOSIS — E1149 Type 2 diabetes mellitus with other diabetic neurological complication: Secondary | ICD-10-CM | POA: Diagnosis not present

## 2019-12-12 DIAGNOSIS — C439 Malignant melanoma of skin, unspecified: Secondary | ICD-10-CM | POA: Diagnosis not present

## 2019-12-12 DIAGNOSIS — R7401 Elevation of levels of liver transaminase levels: Secondary | ICD-10-CM | POA: Diagnosis not present

## 2019-12-12 DIAGNOSIS — R000 Tachycardia, unspecified: Secondary | ICD-10-CM | POA: Diagnosis not present

## 2019-12-12 DIAGNOSIS — E1140 Type 2 diabetes mellitus with diabetic neuropathy, unspecified: Secondary | ICD-10-CM | POA: Diagnosis not present

## 2019-12-12 DIAGNOSIS — E11311 Type 2 diabetes mellitus with unspecified diabetic retinopathy with macular edema: Secondary | ICD-10-CM | POA: Diagnosis not present

## 2019-12-12 DIAGNOSIS — E7849 Other hyperlipidemia: Secondary | ICD-10-CM | POA: Diagnosis not present

## 2019-12-12 DIAGNOSIS — E1142 Type 2 diabetes mellitus with diabetic polyneuropathy: Secondary | ICD-10-CM | POA: Diagnosis not present

## 2020-04-12 DIAGNOSIS — M1990 Unspecified osteoarthritis, unspecified site: Secondary | ICD-10-CM | POA: Diagnosis not present

## 2020-04-12 DIAGNOSIS — R945 Abnormal results of liver function studies: Secondary | ICD-10-CM | POA: Diagnosis not present

## 2020-04-12 DIAGNOSIS — I10 Essential (primary) hypertension: Secondary | ICD-10-CM | POA: Diagnosis not present

## 2020-04-12 DIAGNOSIS — I831 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: Secondary | ICD-10-CM | POA: Diagnosis not present

## 2020-04-12 DIAGNOSIS — G4730 Sleep apnea, unspecified: Secondary | ICD-10-CM | POA: Diagnosis not present

## 2020-04-12 DIAGNOSIS — E1140 Type 2 diabetes mellitus with diabetic neuropathy, unspecified: Secondary | ICD-10-CM | POA: Diagnosis not present

## 2020-04-12 DIAGNOSIS — I700 Atherosclerosis of aorta: Secondary | ICD-10-CM | POA: Diagnosis not present

## 2020-04-12 DIAGNOSIS — E785 Hyperlipidemia, unspecified: Secondary | ICD-10-CM | POA: Diagnosis not present

## 2021-12-02 ENCOUNTER — Emergency Department (HOSPITAL_BASED_OUTPATIENT_CLINIC_OR_DEPARTMENT_OTHER): Payer: Medicare HMO

## 2021-12-02 ENCOUNTER — Other Ambulatory Visit: Payer: Self-pay

## 2021-12-02 ENCOUNTER — Encounter (HOSPITAL_BASED_OUTPATIENT_CLINIC_OR_DEPARTMENT_OTHER): Payer: Self-pay | Admitting: Obstetrics and Gynecology

## 2021-12-02 ENCOUNTER — Emergency Department (HOSPITAL_BASED_OUTPATIENT_CLINIC_OR_DEPARTMENT_OTHER)
Admission: EM | Admit: 2021-12-02 | Discharge: 2021-12-02 | Disposition: A | Discharge status: Home / Self Care | Payer: Medicare HMO | Attending: Emergency Medicine | Admitting: Emergency Medicine

## 2021-12-02 DIAGNOSIS — E1165 Type 2 diabetes mellitus with hyperglycemia: Secondary | ICD-10-CM | POA: Diagnosis not present

## 2021-12-02 DIAGNOSIS — Z794 Long term (current) use of insulin: Secondary | ICD-10-CM | POA: Diagnosis not present

## 2021-12-02 DIAGNOSIS — G510 Bell's palsy: Secondary | ICD-10-CM | POA: Insufficient documentation

## 2021-12-02 DIAGNOSIS — I10 Essential (primary) hypertension: Secondary | ICD-10-CM | POA: Insufficient documentation

## 2021-12-02 DIAGNOSIS — Z7982 Long term (current) use of aspirin: Secondary | ICD-10-CM | POA: Insufficient documentation

## 2021-12-02 DIAGNOSIS — R000 Tachycardia, unspecified: Secondary | ICD-10-CM | POA: Diagnosis not present

## 2021-12-02 DIAGNOSIS — Z7984 Long term (current) use of oral hypoglycemic drugs: Secondary | ICD-10-CM | POA: Diagnosis not present

## 2021-12-02 DIAGNOSIS — R29810 Facial weakness: Secondary | ICD-10-CM | POA: Diagnosis not present

## 2021-12-02 HISTORY — DX: Essential (primary) hypertension: I10

## 2021-12-02 HISTORY — DX: Malignant (primary) neoplasm, unspecified: C80.1

## 2021-12-02 HISTORY — DX: Type 2 diabetes mellitus without complications: E11.9

## 2021-12-02 HISTORY — DX: Unspecified osteoarthritis, unspecified site: M19.90

## 2021-12-02 LAB — CBG MONITORING, ED: Glucose-Capillary: 167 mg/dL — ABNORMAL HIGH (ref 70–99)

## 2021-12-02 MED ORDER — VALACYCLOVIR HCL 1 G PO TABS: 1000.0000 mg | ORAL_TABLET | Freq: Three times a day (TID) | ORAL | 0 refills | Status: AC

## 2021-12-02 MED ORDER — ERYTHROMYCIN 5 MG/GM OP OINT
TOPICAL_OINTMENT | Freq: Once | OPHTHALMIC | Status: AC
  Administered 2021-12-02: 1 via OPHTHALMIC
  Filled 2021-12-02: qty 3.5

## 2021-12-02 MED ORDER — PREDNISONE 20 MG PO TABS: 40.0000 mg | ORAL_TABLET | Freq: Every day | ORAL | 0 refills | Status: AC

## 2021-12-02 NOTE — ED Provider Notes (Signed)
?  Physical Exam  ?BP (!) 158/104 (BP Location: Right Arm)   Pulse (!) 112   Temp 98.5 ?F (36.9 ?C)   Resp (!) 24   SpO2 97%  ? ?Physical Exam ?Vitals and nursing note reviewed.  ?Constitutional:   ?   General: He is not in acute distress. ?   Appearance: He is obese.  ?HENT:  ?   Head: Normocephalic and atraumatic.  ?Eyes:  ?   Conjunctiva/sclera: Conjunctivae normal.  ?   Pupils: Pupils are equal, round, and reactive to light.  ?Cardiovascular:  ?   Rate and Rhythm: Normal rate and regular rhythm.  ?Pulmonary:  ?   Effort: Pulmonary effort is normal. No respiratory distress.  ?Abdominal:  ?   General: There is no distension.  ?   Tenderness: There is no guarding.  ?Musculoskeletal:     ?   General: No deformity or signs of injury.  ?   Cervical back: Neck supple.  ?Skin: ?   Findings: No lesion or rash.  ?Neurological:  ?   Mental Status: He is alert.  ?   Comments: MENTAL STATUS EXAM:    ?Orientation: Alert and oriented to person, place and time.  ?Memory: Cooperative, follows commands well.  ?Language: Speech is clear and language is normal.  ? ?CRANIAL NERVES:    ?CN 2 (Optic): Visual fields intact to confrontation.  ?CN 3,4,6 (EOM): Pupils equal and reactive to light. Full extraocular eye movement without nystagmus.  ?CN 5 (Trigeminal): Facial sensation is normal, no weakness of masticatory muscles.  ?CN 7 (Facial): Right facial droop present, involves the forehead ?CN 8 (Auditory): Auditory acuity grossly normal.  ?CN 9,10 (Glossophar): The uvula is midline, the palate elevates symmetrically.  ?CN 11 (spinal access): Normal sternocleidomastoid and trapezius strength.  ?CN 12 (Hypoglossal): The tongue is midline. No atrophy or fasciculations..  ? ?  ? ? ?Procedures  ?Procedures ? ?ED Course / MDM  ?  ?Medical Decision Making ?Amount and/or Complexity of Data Reviewed ?Radiology: ordered. ? ?Risk ?Prescription drug management. ? ? ? ? ?Patient presents to the emergency department with right-sided facial  droop for the past 3 days.  Clinically consistent with Bell's palsy.  Low suspicion for CVA as the facial droop does involve the forehead.  CT head was obtained and revealed no evidence of mass or other acute abnormality.  We will plan for discharge and outpatient neurology follow-up with plan for treatment for Bell's palsy ? ? ?  ?Regan Lemming, MD ?12/02/21 1636 ? ?

## 2021-12-02 NOTE — ED Notes (Signed)
Discharge instructions, follow up care, and prescriptions reviewed and explained, pt verbalized understanding.  

## 2021-12-02 NOTE — Discharge Instructions (Addendum)
Follow-up with your primary care doctor, neurology, eye doctor.  I have started you on antiviral and steroids to help with what I suspect is Bell's palsy.  Antibiotic ointment given for your eye please use 4 times a day for the next several days.  Recommend also buying artificial tears over-the-counter to use as often as you would like for your eye.  Consider taping  eye shut at night see do not scratch her eye. ?

## 2021-12-02 NOTE — ED Notes (Signed)
Patient returned from radiology at this Time. ?

## 2021-12-02 NOTE — ED Triage Notes (Signed)
Patient reports to the ER for right sided facial droop with possible infection to the right eye. Patient reports he believes he has Bell Palsy. Patient states he is having no other neuro deficits.  ?

## 2021-12-02 NOTE — ED Provider Notes (Signed)
?[facility] DEPT ?Provider Note ? ? ?CSN: [id number] ?Arrival date & time: 12/02/21  1248 ? ?  ? ?History ? ?Chief Complaint  ?Patient presents with  ? Facial Droop  ?   ?  ? ? ?Corey Fitzgerald is a 76 y.o. male. ? ?Patient with history of diabetes, hypertension who presents to the ED with right-sided facial droop for the last 3 days.  Patient states he feels like he has Bell's palsy.  He has noticed right-sided facial droop for the last 3 days.  Nothing has made it worse or better.  Discussed some irritation in his right eye.  Denies any difficulty with speech, vision changes, weakness in his arms or legs or numbness.  Had a headache but no longer has a headache.  Denies any history of stroke.  Denies any ear pain, difficulty swallowing. ? ?The history is provided by the patient.  ? ?  ? ?Home Medications ?Prior to Admission medications   ?Medication Sig Start Date End Date Taking? Authorizing Provider  ?aspirin 81 MG chewable tablet Chew by mouth daily.   Yes [provider]  ?glucosamine-chondroitin 500-400 MG tablet Take 1 tablet by mouth 3 (three) times daily.   Yes [provider]  ?insulin aspart (NOVOLOG) 100 UNIT/ML injection Inject into the skin 3 (three) times daily before meals.   Yes [provider]  ?insulin glargine (LANTUS) 100 UNIT/ML injection Inject 15 Units into the skin daily.   Yes [provider]  ?magnesium oxide (MAG-OX) 400 MG tablet Take 400 mg by mouth daily.   Yes [provider]  ?metFORMIN (GLUCOPHAGE) 1000 MG tablet Take 1,000 mg by mouth 2 (two) times daily with a meal.   Yes [provider]  ?multivitamin-iron-minerals-folic acid (CENTRUM) chewable tablet Chew 1 tablet by mouth daily.   Yes [provider]  ?predniSONE (DELTASONE) 20 MG tablet Take 2 tablets (40 mg total) by mouth daily for 5 days. 12/02/21 12/07/21 Yes Julus Kelley, DO  ?saw palmetto 160 MG capsule Take 160 mg by mouth 2 (two)  times daily.   Yes [provider]  ?valACYclovir (VALTREX) 1000 MG tablet Take 1 tablet (1,000 mg total) by mouth 3 (three) times daily for 7 days. 12/02/21 12/09/21 Yes Vernal Hritz, DO  ?   ? ?Allergies    ?Patient has no known allergies.   ? ?Review of Systems   ?Review of Systems ? ?Physical Exam ?Updated Vital Signs ?BP (!) 158/104 (BP Location: Right Arm)   Pulse (!) 112   Temp 98.5 ?F (36.9 ?C)   Resp (!) 24   SpO2 97%  ?Physical Exam ?Vitals and nursing note reviewed.  ?Constitutional:   ?   General: He is not in acute distress. ?   Appearance: He is well-developed. He is not ill-appearing.  ?HENT:  ?   Head: Normocephalic and atraumatic.  ?   Right Ear: Tympanic membrane normal.  ?   Left Ear: Tympanic membrane normal.  ?   Nose: Nose normal.  ?   Mouth/Throat:  ?   Mouth: Mucous membranes are moist.  ?Eyes:  ?   Extraocular Movements: Extraocular movements intact.  ?   Conjunctiva/sclera: Conjunctivae normal.  ?   Pupils: Pupils are equal, round, and reactive to light.  ?   Comments: Slight incomplete closure of the right eye, there is some mild irritation of the conjunctiva on the right  ?Cardiovascular:  ?   Rate and Rhythm: Normal rate and regular rhythm.  ?  Pulses: Normal pulses.  ?   Heart sounds: Normal heart sounds. No murmur heard. ?Pulmonary:  ?   Effort: Pulmonary effort is normal. No respiratory distress.  ?   Breath sounds: Normal breath sounds.  ?Abdominal:  ?   Palpations: Abdomen is soft.  ?   Tenderness: There is no abdominal tenderness.  ?Musculoskeletal:     ?   General: No swelling.  ?   Cervical back: Neck supple.  ?Skin: ?   General: Skin is warm and dry.  ?   Capillary Refill: Capillary refill takes less than 2 seconds.  ?Neurological:  ?   Mental Status: He is alert and oriented to person, place, and time.  ?   Sensory: No sensory deficit.  ?   Coordination: Coordination normal.  ?   Comments: Patient with right-sided facial droop including his forehead.  Some mild  incomplete closure of the right eye.  Otherwise he is 5+ out of 5 strength throughout, normal sensation, no aphasia, no visual field deficit, normal finger-nose-finger, normal heel-to-shin  ?Psychiatric:     ?   Mood and Affect: Mood normal.  ? ? ?ED Results / Procedures / Treatments   ?Labs ?(all labs ordered are listed, but only abnormal results are displayed) ?Labs Reviewed  ?CBG MONITORING, ED - Abnormal; Notable for the following components:  ?    Result Value  ? Glucose-Capillary 167 (*)   ? All other components within normal limits  ? ? ?EKG ?EKG Interpretation ? ?Date/Time:  Monday Dec 02 2021 14:31:04 EDT ?Ventricular Rate:  111 ?PR Interval:  150 ?QRS Duration: 99 ?QT Interval:  328 ?QTC Calculation: 446 ?R Axis:   -40 ?Text Interpretation: Sinus tachycardia Left anterior fascicular block Abnormal R-wave progression, late transition Confirmed by Lennice Sites [phone]) on 12/02/2021 2:48:27 PM ? ?Radiology ?No results found. ? ?Procedures ?Procedures  ? ? ?Medications Ordered in ED ?Medications  ?erythromycin ophthalmic ointment (1 application. Right Eye Given 12/02/21 1433)  ? ? ?ED Course/ Medical Decision Making/ A&P ?  ?                        ?Medical Decision Making ?Amount and/or Complexity of Data Reviewed ?Radiology: ordered. ? ?Risk ?Prescription drug management. ? ? ?Corey Fitzgerald is here with right-sided facial droop.  Unremarkable vitals.  No fever.  History of hypertension and diabetes.  Clinically on exam appears that he has a Bell's palsy.  He has weakness to the right side of his face including his forehead.  He is unable to raise his eyebrows on the right when compared to the left.  He has normal sensation, strength otherwise.  He has no speech difficulty, vision loss.  Symptoms have been ongoing for the last 3 days.  We will get a head CT to make sure there is no head bleed or brain mass.  But I do not think this is stroke and can hold off on MRI.  He is a diabetic and on insulin.  He  feels like he can make adjustments to his insulin to account for prednisone that I would like to start.  We will also treat with antiviral.  We will give him erythromycin eye ointment as he does have very mild irritation of the conjunctiva.  Educated him about eye management.  We will refer him to ophthalmology and neurology. ? ?Patient signed out pending CT scan of his head.  EKG shows sinus rhythm.  No ischemic changes.  Blood sugar 167.  Overall suspect Bell's palsy.  Will rule out head bleed, large brain mass. ? ?This chart was dictated using voice recognition software.  Despite best efforts to proofread,  errors can occur which can change the documentation meaning.  ? ? ? ? ? ? ? ? ?Final Clinical Impression(s) / ED Diagnoses ?Final diagnoses:  ?Bell's palsy  ? ? ?Rx / DC Orders ?ED Discharge Orders   ? ?      Ordered  ?  predniSONE (DELTASONE) 20 MG tablet  Daily       ? 12/02/21 1417  ?  valACYclovir (VALTREX) 1000 MG tablet  3 times daily       ? 12/02/21 1417  ?  Ambulatory referral to Neurology       ?Comments: An appointment is requested in approximately: 1 week  ? 12/02/21 1417  ? ?  ?  ? ?  ? ? ?  ?Lennice Sites, DO ?12/02/21 1527 ? ?

## 2021-12-10 DIAGNOSIS — G319 Degenerative disease of nervous system, unspecified: Secondary | ICD-10-CM | POA: Diagnosis not present

## 2021-12-10 DIAGNOSIS — Z794 Long term (current) use of insulin: Secondary | ICD-10-CM | POA: Diagnosis not present

## 2021-12-10 DIAGNOSIS — N401 Enlarged prostate with lower urinary tract symptoms: Secondary | ICD-10-CM | POA: Diagnosis not present

## 2021-12-10 DIAGNOSIS — E785 Hyperlipidemia, unspecified: Secondary | ICD-10-CM | POA: Diagnosis not present

## 2021-12-10 DIAGNOSIS — I700 Atherosclerosis of aorta: Secondary | ICD-10-CM | POA: Diagnosis not present

## 2021-12-10 DIAGNOSIS — E1140 Type 2 diabetes mellitus with diabetic neuropathy, unspecified: Secondary | ICD-10-CM | POA: Diagnosis not present

## 2021-12-10 DIAGNOSIS — R945 Abnormal results of liver function studies: Secondary | ICD-10-CM | POA: Diagnosis not present

## 2021-12-10 DIAGNOSIS — N1831 Chronic kidney disease, stage 3a: Secondary | ICD-10-CM | POA: Diagnosis not present

## 2021-12-10 DIAGNOSIS — I129 Hypertensive chronic kidney disease with stage 1 through stage 4 chronic kidney disease, or unspecified chronic kidney disease: Secondary | ICD-10-CM | POA: Diagnosis not present

## 2022-04-08 DIAGNOSIS — B372 Candidiasis of skin and nail: Secondary | ICD-10-CM | POA: Diagnosis not present

## 2022-04-08 DIAGNOSIS — R945 Abnormal results of liver function studies: Secondary | ICD-10-CM | POA: Diagnosis not present

## 2022-04-08 DIAGNOSIS — R739 Hyperglycemia, unspecified: Secondary | ICD-10-CM | POA: Diagnosis not present

## 2022-04-08 DIAGNOSIS — E1140 Type 2 diabetes mellitus with diabetic neuropathy, unspecified: Secondary | ICD-10-CM | POA: Diagnosis not present

## 2022-05-08 DIAGNOSIS — R413 Other amnesia: Secondary | ICD-10-CM | POA: Diagnosis not present

## 2022-05-08 DIAGNOSIS — Z1152 Encounter for screening for COVID-19: Secondary | ICD-10-CM | POA: Diagnosis not present

## 2022-05-08 DIAGNOSIS — R509 Fever, unspecified: Secondary | ICD-10-CM | POA: Diagnosis not present

## 2022-05-08 DIAGNOSIS — R739 Hyperglycemia, unspecified: Secondary | ICD-10-CM | POA: Diagnosis not present

## 2022-05-08 DIAGNOSIS — R531 Weakness: Secondary | ICD-10-CM | POA: Diagnosis not present

## 2022-05-08 DIAGNOSIS — I129 Hypertensive chronic kidney disease with stage 1 through stage 4 chronic kidney disease, or unspecified chronic kidney disease: Secondary | ICD-10-CM | POA: Diagnosis not present

## 2022-05-08 DIAGNOSIS — E1140 Type 2 diabetes mellitus with diabetic neuropathy, unspecified: Secondary | ICD-10-CM | POA: Diagnosis not present

## 2022-05-08 DIAGNOSIS — F329 Major depressive disorder, single episode, unspecified: Secondary | ICD-10-CM | POA: Diagnosis not present

## 2023-01-12 DIAGNOSIS — C439 Malignant melanoma of skin, unspecified: Secondary | ICD-10-CM | POA: Diagnosis not present

## 2023-01-12 DIAGNOSIS — Z794 Long term (current) use of insulin: Secondary | ICD-10-CM | POA: Diagnosis not present

## 2023-01-12 DIAGNOSIS — E1140 Type 2 diabetes mellitus with diabetic neuropathy, unspecified: Secondary | ICD-10-CM | POA: Diagnosis not present

## 2023-01-12 DIAGNOSIS — G319 Degenerative disease of nervous system, unspecified: Secondary | ICD-10-CM | POA: Diagnosis not present

## 2023-01-12 DIAGNOSIS — N401 Enlarged prostate with lower urinary tract symptoms: Secondary | ICD-10-CM | POA: Diagnosis not present

## 2023-01-12 DIAGNOSIS — I129 Hypertensive chronic kidney disease with stage 1 through stage 4 chronic kidney disease, or unspecified chronic kidney disease: Secondary | ICD-10-CM | POA: Diagnosis not present

## 2023-01-12 DIAGNOSIS — N1831 Chronic kidney disease, stage 3a: Secondary | ICD-10-CM | POA: Diagnosis not present

## 2023-01-12 DIAGNOSIS — I700 Atherosclerosis of aorta: Secondary | ICD-10-CM | POA: Diagnosis not present

## 2023-01-12 DIAGNOSIS — E785 Hyperlipidemia, unspecified: Secondary | ICD-10-CM | POA: Diagnosis not present

## 2023-04-08 DIAGNOSIS — R413 Other amnesia: Secondary | ICD-10-CM | POA: Diagnosis not present

## 2023-04-08 DIAGNOSIS — E785 Hyperlipidemia, unspecified: Secondary | ICD-10-CM | POA: Diagnosis not present

## 2023-04-08 DIAGNOSIS — N1831 Chronic kidney disease, stage 3a: Secondary | ICD-10-CM | POA: Diagnosis not present

## 2023-04-08 DIAGNOSIS — I129 Hypertensive chronic kidney disease with stage 1 through stage 4 chronic kidney disease, or unspecified chronic kidney disease: Secondary | ICD-10-CM | POA: Diagnosis not present

## 2023-04-08 DIAGNOSIS — C439 Malignant melanoma of skin, unspecified: Secondary | ICD-10-CM | POA: Diagnosis not present

## 2023-04-08 DIAGNOSIS — E1140 Type 2 diabetes mellitus with diabetic neuropathy, unspecified: Secondary | ICD-10-CM | POA: Diagnosis not present

## 2023-04-08 DIAGNOSIS — Z794 Long term (current) use of insulin: Secondary | ICD-10-CM | POA: Diagnosis not present

## 2023-07-23 DIAGNOSIS — E785 Hyperlipidemia, unspecified: Secondary | ICD-10-CM | POA: Diagnosis not present

## 2023-07-23 DIAGNOSIS — E1140 Type 2 diabetes mellitus with diabetic neuropathy, unspecified: Secondary | ICD-10-CM | POA: Diagnosis not present

## 2023-07-23 DIAGNOSIS — Z794 Long term (current) use of insulin: Secondary | ICD-10-CM | POA: Diagnosis not present

## 2023-07-23 DIAGNOSIS — N1831 Chronic kidney disease, stage 3a: Secondary | ICD-10-CM | POA: Diagnosis not present

## 2023-07-23 DIAGNOSIS — I129 Hypertensive chronic kidney disease with stage 1 through stage 4 chronic kidney disease, or unspecified chronic kidney disease: Secondary | ICD-10-CM | POA: Diagnosis not present

## 2023-09-14 DIAGNOSIS — G319 Degenerative disease of nervous system, unspecified: Secondary | ICD-10-CM | POA: Diagnosis not present

## 2023-09-14 DIAGNOSIS — Z947 Corneal transplant status: Secondary | ICD-10-CM | POA: Diagnosis not present

## 2023-09-14 DIAGNOSIS — N1831 Chronic kidney disease, stage 3a: Secondary | ICD-10-CM | POA: Diagnosis not present

## 2023-09-14 DIAGNOSIS — I5032 Chronic diastolic (congestive) heart failure: Secondary | ICD-10-CM | POA: Diagnosis not present

## 2023-09-14 DIAGNOSIS — E1140 Type 2 diabetes mellitus with diabetic neuropathy, unspecified: Secondary | ICD-10-CM | POA: Diagnosis not present

## 2023-09-14 DIAGNOSIS — E785 Hyperlipidemia, unspecified: Secondary | ICD-10-CM | POA: Diagnosis not present

## 2023-09-14 DIAGNOSIS — C439 Malignant melanoma of skin, unspecified: Secondary | ICD-10-CM | POA: Diagnosis not present

## 2023-09-14 DIAGNOSIS — R413 Other amnesia: Secondary | ICD-10-CM | POA: Diagnosis not present

## 2023-09-14 DIAGNOSIS — I129 Hypertensive chronic kidney disease with stage 1 through stage 4 chronic kidney disease, or unspecified chronic kidney disease: Secondary | ICD-10-CM | POA: Diagnosis not present

## 2023-09-14 DIAGNOSIS — I6523 Occlusion and stenosis of bilateral carotid arteries: Secondary | ICD-10-CM | POA: Diagnosis not present

## 2023-09-14 DIAGNOSIS — I130 Hypertensive heart and chronic kidney disease with heart failure and stage 1 through stage 4 chronic kidney disease, or unspecified chronic kidney disease: Secondary | ICD-10-CM | POA: Diagnosis not present

## 2023-09-14 DIAGNOSIS — Z794 Long term (current) use of insulin: Secondary | ICD-10-CM | POA: Diagnosis not present

## 2023-10-02 ENCOUNTER — Other Ambulatory Visit: Payer: Self-pay

## 2023-10-02 DIAGNOSIS — E119 Type 2 diabetes mellitus without complications: Secondary | ICD-10-CM | POA: Insufficient documentation

## 2023-10-02 DIAGNOSIS — I6523 Occlusion and stenosis of bilateral carotid arteries: Secondary | ICD-10-CM | POA: Insufficient documentation

## 2023-10-02 DIAGNOSIS — I700 Atherosclerosis of aorta: Secondary | ICD-10-CM | POA: Insufficient documentation

## 2023-10-02 DIAGNOSIS — E785 Hyperlipidemia, unspecified: Secondary | ICD-10-CM | POA: Insufficient documentation

## 2023-10-02 DIAGNOSIS — G4730 Sleep apnea, unspecified: Secondary | ICD-10-CM | POA: Insufficient documentation

## 2023-10-02 DIAGNOSIS — R945 Abnormal results of liver function studies: Secondary | ICD-10-CM | POA: Insufficient documentation

## 2023-10-02 DIAGNOSIS — R413 Other amnesia: Secondary | ICD-10-CM | POA: Insufficient documentation

## 2023-10-02 DIAGNOSIS — G319 Degenerative disease of nervous system, unspecified: Secondary | ICD-10-CM | POA: Insufficient documentation

## 2023-10-02 DIAGNOSIS — N529 Male erectile dysfunction, unspecified: Secondary | ICD-10-CM | POA: Insufficient documentation

## 2023-10-02 DIAGNOSIS — E1140 Type 2 diabetes mellitus with diabetic neuropathy, unspecified: Secondary | ICD-10-CM | POA: Insufficient documentation

## 2023-10-02 DIAGNOSIS — N1831 Chronic kidney disease, stage 3a: Secondary | ICD-10-CM | POA: Insufficient documentation

## 2023-10-02 DIAGNOSIS — M1990 Unspecified osteoarthritis, unspecified site: Secondary | ICD-10-CM | POA: Insufficient documentation

## 2023-10-02 DIAGNOSIS — E291 Testicular hypofunction: Secondary | ICD-10-CM | POA: Insufficient documentation

## 2023-10-02 DIAGNOSIS — Z947 Corneal transplant status: Secondary | ICD-10-CM | POA: Insufficient documentation

## 2023-10-02 DIAGNOSIS — F32A Depression, unspecified: Secondary | ICD-10-CM | POA: Insufficient documentation

## 2023-10-02 DIAGNOSIS — I10 Essential (primary) hypertension: Secondary | ICD-10-CM | POA: Insufficient documentation

## 2023-10-02 DIAGNOSIS — I5032 Chronic diastolic (congestive) heart failure: Secondary | ICD-10-CM | POA: Insufficient documentation

## 2023-10-02 DIAGNOSIS — C439 Malignant melanoma of skin, unspecified: Secondary | ICD-10-CM | POA: Insufficient documentation

## 2023-10-02 DIAGNOSIS — N401 Enlarged prostate with lower urinary tract symptoms: Secondary | ICD-10-CM | POA: Insufficient documentation

## 2023-10-02 DIAGNOSIS — C801 Malignant (primary) neoplasm, unspecified: Secondary | ICD-10-CM | POA: Insufficient documentation

## 2023-10-02 DIAGNOSIS — I831 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: Secondary | ICD-10-CM | POA: Insufficient documentation

## 2023-10-06 ENCOUNTER — Ambulatory Visit

## 2023-10-12 ENCOUNTER — Ambulatory Visit

## 2023-10-12 VITALS — BP 159/84 | HR 121 | Ht 70.0 in | Wt 300.4 lb

## 2023-10-12 DIAGNOSIS — E782 Mixed hyperlipidemia: Secondary | ICD-10-CM | POA: Diagnosis not present

## 2023-10-12 DIAGNOSIS — I5032 Chronic diastolic (congestive) heart failure: Secondary | ICD-10-CM | POA: Diagnosis not present

## 2023-10-12 DIAGNOSIS — I10 Essential (primary) hypertension: Secondary | ICD-10-CM | POA: Diagnosis not present

## 2023-10-12 MED ORDER — FUROSEMIDE 40 MG PO TABS: 40.0000 mg | ORAL_TABLET | Freq: Every day | ORAL | 3 refills | Status: DC

## 2023-10-12 MED ORDER — ROSUVASTATIN CALCIUM 5 MG PO TABS: 5.0000 mg | ORAL_TABLET | Freq: Every day | ORAL | 3 refills | Status: AC

## 2023-10-12 NOTE — Assessment & Plan Note (Signed)
 Suboptimal. First visit here. Advised to keep a log at home and/or with PCP visits.  Will review echocardiogram results and blood work results and further optimize if follow-up blood pressure readings remain uncontrolled. Anticipate medication such as ACE inhibitor/ARB/Entresto once started for his diastolic heart failure will help with the blood pressures.

## 2023-10-12 NOTE — Assessment & Plan Note (Signed)
 He does have risk factors for cardiovascular disease. Recent lipid panel from 2-20 12/2023 not optimal with LDL 101, total cholesterol 170, HDL 39, triglycerides 150. Continue optimization of diabetes management. Discussed role of statins in the setting.  He did not tolerate atorvastatin in the past. Reviewed potential use of Crestor at a small dose and see if he can tolerate. If unable to tolerate, can consider Zetia or PCSK9 inhibitors.  His wife uses Repatha. He is agreeable.  Start Crestor 5 mg once daily. If tolerating well, will review liver function tests in about 1 month has been ordered above.

## 2023-10-12 NOTE — Progress Notes (Signed)
 Cardiology Consultation:    Date:  10/12/2023   ID:  TRUEMAN WORLDS, DOB August 04, 1945, MRN [id number]  PCP:  Adrian Prince, MD  Cardiologist:  Marlyn Corporal Lanyiah Brix, MD   Referring MD: Adrian Prince, MD   No chief complaint on file.    ASSESSMENT AND PLAN:   Mr. 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  Remote echocardiogram report from November 2013 noted moderate LVH with EF 65% and grade 1 diastolic dysfunction and mild RV dilatation with normal function.    Problem List Items Addressed This Visit     Diastolic CHF, chronic (HCC) - Primary   He does have phenotype at high risk for chronic diastolic CHF. He does have bilateral lower extremity edema.  Recent BNP was unremarkable.  Will further evaluate with transthoracic echocardiogram for cardiac structure and function assessment.  Advised strict dietary sodium restriction to below 2 g/day. Fluid restriction to about 2 L/day.  Advised weight monitoring daily at home. Continue furosemide 40 mg once daily in the morning and add an additional 40 mg in the afternoon every other day.  Will repeat CMP tentatively in 1 month.  Continue with dietary modifications for weight loss. Will review echocardiogram results and further decide on guideline directed medical therapy escalation for diastolic CHF, with possible addition of ACE inhibitor/ARB/Entresto and SGLT2 inhibitors.        Relevant Medications   furosemide (LASIX) 40 MG tablet    rosuvastatin (CRESTOR) 5 MG tablet   Other Relevant Orders   EKG 12-Lead (Completed)   ECHOCARDIOGRAM COMPLETE   Basic metabolic panel   Hyperlipidemia   He does have risk factors for cardiovascular disease. Recent lipid panel from 2-20 12/2023 not optimal with LDL 101, total cholesterol 170, HDL 39, triglycerides 150. Continue optimization of diabetes management. Discussed role of statins in the setting.  He did not tolerate atorvastatin in the past. Reviewed potential use of Crestor at a small dose and see if he can tolerate. If unable to tolerate, can consider Zetia or PCSK9 inhibitors.  His wife uses Repatha. He is agreeable.  Start Crestor 5 mg once daily. If tolerating well, will review liver function tests in about 1 month has been ordered above.      Relevant Medications   furosemide (LASIX) 40 MG tablet   rosuvastatin (CRESTOR) 5 MG tablet   Hypertension   Suboptimal. First visit here. Advised to keep a log at home and/or with PCP visits.  Will review echocardiogram results and blood work results and further optimize if follow-up blood pressure readings remain uncontrolled. Anticipate medication such as ACE inhibitor/ARB/Entresto once started for his diastolic heart failure will help with the blood pressures.       Relevant Medications   furosemide (LASIX) 40 MG tablet   rosuvastatin (CRESTOR) 5 MG tablet   Tentatively for follow-up in 2 to 3 months here at the office.    History of Present Illness:    KOLLIN UDELL is a 78 y.o. male who is being seen today for the evaluation of bilateral lower extremity edema suspicion for chronic diastolic CHF  at the request of Adrian Prince, MD.   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 Denies any prior history of CAD, CHF, MI, CVA.  Pleasant gentleman here for the visit today accompanied by his wife.  Retired after working multiple years at a job that required standing for extended hours.  Mention has been dealing with longstanding history of lower extremity edema.  Weight has been relatively steady around 300 pounds for a long time. Denies any chest pain. Mentions longstanding history of orthopnea.  Sleeps in a recliner at home.  Have had significant symptoms of bilateral lower extremity edema.  Recently PCP transitioned him from hydrochlorothiazide to furosemide 40 mg once daily about 3 weeks ago.  Taking this daily consistently and noticed improved urine output.  Have not yet noticed significant improvement in pedal edema. Admits to eating food in excess, they have tried on calorie restriction for diabetes control.  EKG in the clinic today shows sinus tachycardia heart rate 121/min, PR interval 154 ms, QRS duration left axis.  Low voltage QRS.  Last echocardiogram available to review is from November 2013 moderate LVH with EF 65%, grade 1 diastolic dysfunction, mildly dilated RV with normal function  Blood work from 09-14-2023 notes NT proBNP 87 TSH normal 4.28. Blood work from 09/16/2023 lipid panel total cholesterol 170, HDL 39, LDL 101, triglycerides 150. Hemoglobin A1c 8.91 09-14-2023. ALT 26 on 09/16/2023. CMP with BUN 16, creatinine 0.9, EGFR 81, 09/16/2023  Past Medical History:  Diagnosis Date   Abnormal results of liver function studies    Acquired cerebral atrophy (HCC)    Arthritis    Atherosclerosis of aorta (HCC)    Benign prostatic hyperplasia with lower urinary tract symptoms    Bilateral carotid artery stenosis    Cancer (HCC)    Chronic kidney disease, stage 3a (HCC)    Corneal transplant status    Depression    Diabetes mellitus without  complication (HCC)    Diastolic CHF, chronic (HCC)    Essential (primary) hypertension    Hyperlipidemia    Hypertension    Male erectile disorder    Malignant melanoma of skin (HCC)    Memory changes    Morbid (severe) obesity due to excess calories (HCC)    Sleep apnea    Testicular hypofunction    Type 2 diabetes mellitus with diabetic neuropathy, unspecified (HCC)    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   aspirin 81 MG chewable tablet Chew by mouth daily.   glucosamine-chondroitin 500-400 MG tablet Take 2 tablets by mouth daily.   insulin aspart (NOVOLOG) 100 UNIT/ML injection Inject 20-25 Units into the skin 3 (three) times daily before meals.   insulin glargine (LANTUS) 100 UNIT/ML injection Inject 80 Units into the skin in the morning. And takes 50 units subcutaneous twice a day in the evening.   levothyroxine (SYNTHROID) 75 MCG tablet Take 75 mcg by mouth daily before breakfast.   metFORMIN (GLUCOPHAGE-XR) 500 MG 24 hr tablet Take 1,000 mg by mouth 2 (two) times  daily with a meal.   multivitamin-iron-minerals-folic acid (CENTRUM) chewable tablet Chew 2 tablets by mouth 2 (two) times daily.   rosuvastatin (CRESTOR) 5 MG tablet Take 1 tablet (5 mg total) by mouth daily.   saw palmetto 160 MG capsule Take 160 mg by mouth 2 (two) times daily.   sertraline (ZOLOFT) 100 MG tablet Take 100 mg by mouth daily.   [DISCONTINUED] furosemide (LASIX) 40 MG tablet Take 40 mg by mouth daily.     Allergies:   Atorvastatin and Doxycycline   Social History   Socioeconomic History   Marital status: Married    Spouse name: Not on file   Number of children: Not on file   Years of education: Not on file   Highest education level: Not on file  Occupational History   Not on file   Tobacco Use   Smoking status: Former    Types: Cigarettes    Passive exposure: Past   Smokeless tobacco: Never  Vaping Use   Vaping status: Never Used  Substance and Sexual Activity   Alcohol use: Not Currently   Drug use: Not Currently   Sexual activity: Yes  Other Topics Concern   Not on file  Social History Narrative   Not on file   Social Drivers of Health   Financial Resource Strain: Not on file  Food Insecurity: Not on file  Transportation Needs: Not on file  Physical Activity: Not on file  Stress: Not on file  Social Connections: Not on file     Family History: The patient's family history includes COPD in his father; Cancer in his sister; Heart failure in his father; Stroke in his father. ROS:   Please see the history of present illness.    All 14 point review of systems negative except as described per history of present illness.  EKGs/Labs/Other Studies Reviewed:    The following studies were reviewed today:   EKG:  EKG Interpretation Date/Time:  Monday October 12 2023 14:48:01 EDT Ventricular Rate:  121 PR Interval:  154 QRS Duration:  86 QT Interval:  312 QTC Calculation: 443 R Axis:   -69  Text Interpretation: Sinus tachycardia Left axis deviation Low voltage QRS Inferior infarct , age undetermined Cannot rule out Anterior infarct , age undetermined Abnormal ECG When compared with ECG of 02-Dec-2021 14:31, PREVIOUS ECG IS PRESENT Confirmed by Huntley Dec reddy [phone]) on 10/12/2023 3:04:03 PM    Recent Labs: No results found for requested labs within last 365 days.  Recent Lipid Panel No results found for: "CHOL", "TRIG", "HDL", "CHOLHDL", "VLDL", "LDLCALC", "LDLDIRECT"  Physical Exam:    VS:  BP (!) 159/84   Pulse (!) 121   Ht 5\' 10"  (1.778 m)   Wt (!) 300 lb 6.4 oz (136.3 kg)   SpO2 94%   BMI 43.10 kg/m     Wt Readings from Last 3 Encounters:  10/12/23 (!) 300 lb 6.4 oz (136.3 kg)     GENERAL:  Well nourished, well developed in  no acute distress NECK: No JVD; No carotid bruits CARDIAC: RRR, S1 and S2 present, no murmurs, no rubs, no gallops CHEST:  Clear to auscultation without rales, wheezing or rhonchi  Extremities: Mild bilateral pitting pedal edema up to the knees.  Pulses bilaterally symmetric with radial 2+ and dorsalis pedis 1 + NEUROLOGIC:  Alert and oriented x 3  Medication Adjustments/Labs and Tests Ordered: Current medicines are reviewed at length with the patient today.  Concerns regarding medicines are outlined above.  Orders Placed This Encounter  Procedures   Basic metabolic panel   EKG 12-Lead   ECHOCARDIOGRAM COMPLETE   Meds ordered this encounter  Medications   furosemide (LASIX) 40 MG tablet    Sig: Take 1 tablet (40 mg total) by mouth daily. Take 40 mg additional in the evening every other day.    Dispense:  135 tablet    Refill:  3   rosuvastatin (CRESTOR) 5 MG tablet    Sig: Take 1 tablet (5 mg total) by mouth daily.    Dispense:  90 tablet    Refill:  3    Signed, Annalaura Sauseda reddy Tamalyn Wadsworth, MD, MPH, [facility]. 10/12/2023 3:44 PM    [facility]

## 2023-10-12 NOTE — Patient Instructions (Signed)
 Medication Instructions:  Your physician has recommended you make the following change in your medication:   Increase your Furosemide 40 mg daily adding extra 40 mg in the evening every other day.  Sodium restriction 2 gm and 2 liter fluid restriction daily. Keep a log of your weight and let us know if you are retaining fluid 2 pounds per day or 4 pounds in a week.  Start Crestor 5 mg daily.  *If you need a refill on your cardiac medications before your next appointment, please call your pharmacy*   Lab Work: Your physician recommends that you return for lab work in: 1 month for CMP You can come Monday through Friday 8:30 am to 12:00 pm and 1:15 to 4:30. You do not need to make an appointment as the order has already been placed.   If you have labs (blood work) drawn today and your tests are completely normal, you will receive your results only by: MyChart Message (if you have MyChart) OR A paper copy in the mail If you have any lab test that is abnormal or we need to change your treatment, we will call you to review the results.  Testing/Procedures: Your physician has requested that you have an echocardiogram. Echocardiography is a painless test that uses sound waves to create images of your heart. It provides your doctor with information about the size and shape of your heart and how well your heart's chambers and valves are working. This procedure takes approximately one hour. There are no restrictions for this procedure. Please do NOT wear cologne, perfume, aftershave, or lotions (deodorant is allowed). Please arrive 15 minutes prior to your appointment time.  Follow-Up: At [facility], you and your health needs are our priority.  As part of our continuing mission to provide you with exceptional heart care, we have created designated Provider Care Teams.  These Care Teams include your primary Cardiologist (physician) and Advanced Practice Providers (APPs -  Physician Assistants and  Nurse Practitioners) who all work together to provide you with the care you need, when you need it.  We recommend signing up for the patient portal called "MyChart".  Sign up information is provided on this After Visit Summary.  MyChart is used to connect with patients for Virtual Visits (Telemedicine).  Patients are able to view lab/test results, encounter notes, upcoming appointments, etc.  Non-urgent messages can be sent to your provider as well.   To learn more about what you can do with MyChart, go to [email or web address].    Your next appointment:   2-3 month(s)  The format for your next appointment:   In Person  Provider:   Vern Claude Madireddy, MD   Other Instructions Echocardiogram An echocardiogram is a test that uses sound waves (ultrasound) to produce images of the heart. Images from an echocardiogram can provide important information about: Heart size and shape. The size and thickness and movement of your heart's walls. Heart muscle function and strength. Heart valve function or if you have stenosis. Stenosis is when the heart valves are too narrow. If blood is flowing backward through the heart valves (regurgitation). A tumor or infectious growth around the heart valves. Areas of heart muscle that are not working well because of poor blood flow or injury from a heart attack. Aneurysm detection. An aneurysm is a weak or damaged part of an artery wall. The wall bulges out from the normal force of blood pumping through the body. Tell a health care provider about: Any allergies you  have. All medicines you are taking, including vitamins, herbs, eye drops, creams, and over-the-counter medicines. Any blood disorders you have. Any surgeries you have had. Any medical conditions you have. Whether you are pregnant or may be pregnant. What are the risks? Generally, this is a safe test. However, problems may occur, including an allergic reaction to dye (contrast) that may be used  during the test. What happens before the test? No specific preparation is needed. You may eat and drink normally. What happens during the test? You will take off your clothes from the waist up and put on a hospital gown. Electrodes or electrocardiogram (ECG)patches may be placed on your chest. The electrodes or patches are then connected to a device that monitors your heart rate and rhythm. You will lie down on a table for an ultrasound exam. A gel will be applied to your chest to help sound waves pass through your skin. A handheld device, called a transducer, will be pressed against your chest and moved over your heart. The transducer produces sound waves that travel to your heart and bounce back (or "echo" back) to the transducer. These sound waves will be captured in real-time and changed into images of your heart that can be viewed on a video monitor. The images will be recorded on a computer and reviewed by your health care provider. You may be asked to change positions or hold your breath for a short time. This makes it easier to get different views or better views of your heart. In some cases, you may receive contrast through an IV in one of your veins. This can improve the quality of the pictures from your heart. The procedure may vary among health care providers and hospitals.   What can I expect after the test? You may return to your normal, everyday life, including diet, activities, and medicines, unless your health care provider tells you not to do that. Follow these instructions at home: It is up to you to get the results of your test. Ask your health care provider, or the department that is doing the test, when your results will be ready. Keep all follow-up visits. This is important. Summary An echocardiogram is a test that uses sound waves (ultrasound) to produce images of the heart. Images from an echocardiogram can provide important information about the size and shape of your  heart, heart muscle function, heart valve function, and other possible heart problems. You do not need to do anything to prepare before this test. You may eat and drink normally. After the echocardiogram is completed, you may return to your normal, everyday life, unless your health care provider tells you not to do that. This information is not intended to replace advice given to you by your health care provider. Make sure you discuss any questions you have with your health care provider. Document Revised: 02/28/2020 Document Reviewed: 02/28/2020 Elsevier Patient Education  2021 Elsevier Inc.   Important Information About Sugar

## 2023-10-12 NOTE — Assessment & Plan Note (Signed)
 He does have phenotype at high risk for chronic diastolic CHF. He does have bilateral lower extremity edema.  Recent BNP was unremarkable.  Will further evaluate with transthoracic echocardiogram for cardiac structure and function assessment.  Advised strict dietary sodium restriction to below 2 g/day. Fluid restriction to about 2 L/day.  Advised weight monitoring daily at home. Continue furosemide 40 mg once daily in the morning and add an additional 40 mg in the afternoon every other day.  Will repeat CMP tentatively in 1 month.  Continue with dietary modifications for weight loss. Will review echocardiogram results and further decide on guideline directed medical therapy escalation for diastolic CHF, with possible addition of ACE inhibitor/ARB/Entresto and SGLT2 inhibitors.

## 2023-11-02 ENCOUNTER — Other Ambulatory Visit

## 2023-11-12 ENCOUNTER — Ambulatory Visit

## 2023-11-12 DIAGNOSIS — I5032 Chronic diastolic (congestive) heart failure: Secondary | ICD-10-CM

## 2023-11-12 LAB — ECHOCARDIOGRAM COMPLETE: S' Lateral: 3.9 cm

## 2023-11-12 MED ORDER — PERFLUTREN LIPID MICROSPHERE
1.0000 mL | INTRAVENOUS | Status: AC | PRN
  Administered 2023-11-12: 7 mL via INTRAVENOUS

## 2023-11-13 LAB — BASIC METABOLIC PANEL WITH GFR
BUN/Creatinine Ratio: 14 (ref 10–24)
BUN: 15 mg/dL (ref 8–27)
CO2: 22 mmol/L (ref 20–29)
Calcium: 9.9 mg/dL (ref 8.6–10.2)
Chloride: 97 mmol/L (ref 96–106)
Creatinine, Ser: 1.1 mg/dL (ref 0.76–1.27)
Glucose: 194 mg/dL — ABNORMAL HIGH (ref 70–99)
Potassium: 3.8 mmol/L (ref 3.5–5.2)
Sodium: 141 mmol/L (ref 134–144)
eGFR: 69 mL/min/{1.73_m2} (ref >59)

## 2023-11-18 DIAGNOSIS — I129 Hypertensive chronic kidney disease with stage 1 through stage 4 chronic kidney disease, or unspecified chronic kidney disease: Secondary | ICD-10-CM | POA: Diagnosis not present

## 2023-11-18 DIAGNOSIS — E1140 Type 2 diabetes mellitus with diabetic neuropathy, unspecified: Secondary | ICD-10-CM | POA: Diagnosis not present

## 2023-11-18 DIAGNOSIS — Z794 Long term (current) use of insulin: Secondary | ICD-10-CM | POA: Diagnosis not present

## 2023-11-18 DIAGNOSIS — N1831 Chronic kidney disease, stage 3a: Secondary | ICD-10-CM | POA: Diagnosis not present

## 2023-11-18 DIAGNOSIS — E785 Hyperlipidemia, unspecified: Secondary | ICD-10-CM | POA: Diagnosis not present

## 2023-11-20 NOTE — Telephone Encounter (Signed)
 Error

## 2023-11-24 ENCOUNTER — Telehealth: Payer: Self-pay

## 2023-11-24 DIAGNOSIS — I5032 Chronic diastolic (congestive) heart failure: Secondary | ICD-10-CM

## 2023-11-24 NOTE — Telephone Encounter (Signed)
 Pt c/o swelling/edema: STAT if pt has developed SOB within 24 hours  If swelling, where is the swelling located? Legs   How much weight have you gained and in what time span? States he hasn't gained   Have you gained 2 pounds in a day or 5 pounds in a week? No   Do you have a log of your daily weights (if so, list)? No   Are you currently taking a fluid pill? Yes   Are you currently SOB?  Nothing currently   Have you traveled recently in a car or plane for an extended period of time? No    Pt spouse called in stating pt it still having swelling and oozing in legs and some SOB at times. Please advise.

## 2023-11-30 MED ORDER — FUROSEMIDE 40 MG PO TABS: 40.0000 mg | ORAL_TABLET | Freq: Two times a day (BID) | ORAL | 3 refills | Status: DC

## 2023-11-30 NOTE — Telephone Encounter (Signed)
 Recommendations reviewed with Terrea Ferrier per DPR as per Dr. Madireddy's note. Terrea Ferrier verbalized understanding and had no additional questions.

## 2023-12-03 ENCOUNTER — Telehealth: Payer: Self-pay

## 2023-12-03 DIAGNOSIS — I719 Aortic aneurysm of unspecified site, without rupture: Secondary | ICD-10-CM

## 2023-12-03 DIAGNOSIS — I509 Heart failure, unspecified: Secondary | ICD-10-CM

## 2023-12-03 DIAGNOSIS — R600 Localized edema: Secondary | ICD-10-CM

## 2023-12-03 DIAGNOSIS — G4733 Obstructive sleep apnea (adult) (pediatric): Secondary | ICD-10-CM | POA: Insufficient documentation

## 2023-12-03 DIAGNOSIS — E039 Hypothyroidism, unspecified: Secondary | ICD-10-CM

## 2023-12-03 HISTORY — DX: Localized edema: R60.0

## 2023-12-03 HISTORY — DX: Aortic aneurysm of unspecified site, without rupture: I71.9

## 2023-12-03 HISTORY — DX: Heart failure, unspecified: I50.9

## 2023-12-03 HISTORY — DX: Hypothyroidism, unspecified: E03.9

## 2023-12-03 HISTORY — DX: Obstructive sleep apnea (adult) (pediatric): G47.33

## 2023-12-03 NOTE — Telephone Encounter (Signed)
 New Message:       Patient has an appointment tomorrow(12-04-23) with Dr Madireddy. Wife said he needed a BMP. She said he had one at his primary doctor. She wants to know if she can bring the results with her tomorrow?

## 2023-12-03 NOTE — Telephone Encounter (Signed)
 Wife Benancio Bracket) returned RN's call.

## 2023-12-03 NOTE — Telephone Encounter (Signed)
 Advised to bring a copy of labs to appointment and if additional labs are needed he will decide at that time. Corey Fitzgerald verbalized understanding and had no additional questions.

## 2023-12-03 NOTE — Telephone Encounter (Signed)
 Left message for the patient to call back.

## 2023-12-07 ENCOUNTER — Ambulatory Visit

## 2023-12-07 VITALS — BP 162/94 | HR 114 | Ht 70.0 in | Wt 296.8 lb

## 2023-12-07 DIAGNOSIS — I7121 Aneurysm of the ascending aorta, without rupture: Secondary | ICD-10-CM

## 2023-12-07 DIAGNOSIS — Z7189 Other specified counseling: Secondary | ICD-10-CM

## 2023-12-07 DIAGNOSIS — I5032 Chronic diastolic (congestive) heart failure: Secondary | ICD-10-CM | POA: Diagnosis not present

## 2023-12-07 HISTORY — DX: Other specified counseling: Z71.89

## 2023-12-07 MED ORDER — FUROSEMIDE 40 MG PO TABS: 40.0000 mg | ORAL_TABLET | Freq: Every day | ORAL | 3 refills | Status: DC

## 2023-12-07 NOTE — Assessment & Plan Note (Signed)
   Based on review appears within upper limits of normal considering his body surface area and age.

## 2023-12-07 NOTE — Patient Instructions (Addendum)
 Medication Instructions:  Your physician has recommended you make the following change in your medication:   START: Lasix  40 mg (Take 80 mg in the AM and 40 mg in the PM)  Lab Work: Your physician recommends that you return for lab work in:   Labs in 1 week: BMP, Magnesium  If you have labs (blood work) drawn today and your tests are completely normal, you will receive your results only by: [employer] (if you have MyChart) OR A paper copy in the mail If you have any lab test that is abnormal or we need to change your treatment, we will call you to review the results.  Testing/Procedures: None  Follow-Up: At [facility], you and your health needs are our priority.  As part of our continuing mission to provide you with exceptional heart care, our providers are all part of one team.  This team includes your primary Cardiologist (physician) and Advanced Practice Providers or APPs (Physician Assistants and Nurse Practitioners) who all work together to provide you with the care you need, when you need it.  Your next appointment:   4 week(s)  Provider:   Bertha Broad, MD    We recommend signing up for the patient portal called "MyChart".  Sign up information is provided on this After Visit Summary.  MyChart is used to connect with patients for Virtual Visits (Telemedicine).  Patients are able to view lab/test results, encounter notes, upcoming appointments, etc.  Non-urgent messages can be sent to your provider as well.   To learn more about what you can do with MyChart, go to [email or web address].   Other Instructions None

## 2023-12-07 NOTE — Progress Notes (Signed)
 Cardiology Consultation:    Date:  12/07/2023   ID:  Corey Fitzgerald, DOB March 22, 1946, MRN [id number]  PCP:  Rosslyn Coons, MD  Cardiologist:  Daymon Evans Synia Douglass, MD   Referring MD: Rosslyn Coons, MD   No chief complaint on file.    ASSESSMENT AND PLAN:   Corey Fitzgerald 78 year old male with history of morbid obesity, diabetes, CKD stage II, carotid atherosclerosis on ultrasound May 2023, hypertension, obstructive sleep apnea-not using CPAP [did not tolerate], former tobacco use, chronic lumbar pain from spinal stenosis, hyperlipidemia, statin intolerance due to muscle aches, chronic bilateral lower extremity venous insufficiency, bilateral lower extremity edema. Echocardiogram completed 11/12/2023 noted LVEF 60 to 65%, grade 1 diastolic dysfunction, aortic root measured 4 cm which falls within normal limits considering his body surface area. Sinus tachycardia chronically likely in the setting of deconditioning.  Problem List Items Addressed This Visit     Diastolic CHF, chronic (HCC) - Primary   Chronic diastolic heart failure, appears hypervolemic.  Continue furosemide  increase the dose to 80 mg in the morning and 40 mg in the afternoon.  Weight currently 296.8 pounds today. Advised to continue monitoring daily weights.  Repeat blood work BMP and magnesium tentatively in 1 week with higher doses of furosemide . Continue with amlodipine 5 mg once daily After blood work review next week we will consider starting SGLT2 inhibitors and losartan.      Relevant Orders   Basic Metabolic Panel (BMET)   Magnesium   Aortic aneurysm (HCC)     Based on review appears within upper limits of normal considering his body surface area and age.        Return to clinic tentative in 4 weeks.  History of Present Illness:    Corey Fitzgerald is a 78 y.o. male who is being seen today for follow-up visit. PCP is Rosslyn Coons, MD. Last visit with me in the office was  10/12/2023.  Has history of morbid obesity, diabetes, CKD stage II, carotid atherosclerosis on ultrasound May 2023, hypertension, obstructive sleep apnea-not using CPAP [did not tolerate], former tobacco use, chronic lumbar pain from spinal stenosis, hyperlipidemia, statin intolerance due to muscle aches, chronic bilateral lower extremity venous insufficiency, bilateral lower extremity edema. Echocardiogram completed 11/12/2023 noted LVEF 60 to 65%, grade 1 diastolic dysfunction, aortic root measured 4 cm which falls within normal limits considering his body surface area.  Here for visit today accompanied by his wife. Weight has been down 296 to 298 pounds on home log. Mentions symptoms have been somewhat similar with slight improvement. Continues to have significant bilateral lower extremity edema.  Continues to be adherent with the medications as prescribed. Taking furosemide  40 mg twice daily since last week as per our recommendations.  Blood work from 11/12/2023 BUN 15, creatinine 1.1, EGFR 69. Sodium 141, potassium 3.8   Past Medical History:  Diagnosis Date   Abnormal results of liver function studies    Acquired cerebral atrophy [facility])    Aortic aneurysm (HCC) 12/03/2023   Dec 01, 2023 Entered By: Alena An Comment: 40  mm     Arthritis    Atherosclerosis of aorta (HCC)    Benign prostatic hyperplasia with lower urinary tract symptoms    Bilateral carotid artery stenosis    Cancer (HCC)    Chronic kidney disease due to hypertension 08/08/2019   Chronic kidney disease, stage 3a (HCC)    Congestive heart failure (HCC) 12/03/2023   Corneal transplant status    Depression  Diabetes mellitus without complication (HCC)    Diastolic CHF, chronic (HCC)    Edema of lower extremity 12/03/2023   Dec 02, 2023 Entered By: Alena An Comment: B/L     Essential (primary) hypertension    Hyperlipidemia    Hypertension    Hypothyroidism 12/03/2023   Long term current use of  insulin (HCC) 07/24/2017   Lower urinary tract symptoms due to benign prostatic hyperplasia 05/03/2012   Male erectile disorder    Malignant melanoma of skin (HCC)    Memory changes    Morbid (severe) obesity due to excess calories (HCC)    Obstructive sleep apnea of adult 12/03/2023   Dec 01, 2023 Entered By: Aimee Houseman E Comment: cannot tolerate CPAP     Other specified counseling 12/07/2023   Sleep apnea    Testicular hypofunction    Type 2 diabetes mellitus with diabetic neuropathy, unspecified (HCC)    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   aspirin 81 MG chewable tablet Chew by mouth daily.   Continuous Glucose Sensor (FREESTYLE LIBRE 2 PLUS SENSOR) MISC 1 each by Other route as directed.   furosemide  (LASIX ) 40 MG tablet Take 1 tablet (40 mg total) by mouth 2 (two) times daily. Take 40 mg additional in the evening every other day.   glucosamine-chondroitin 500-400 MG tablet Take 2 tablets by mouth daily.   insulin aspart (NOVOLOG) 100 UNIT/ML injection Inject 20-25 Units into the skin 3 (three) times daily before meals.   insulin glargine (LANTUS) 100 UNIT/ML injection Inject 80 Units into the skin in the morning. And takes 50 units subcutaneous twice a day in the evening.   levothyroxine (SYNTHROID) 75 MCG tablet Take 75 mcg by mouth daily before breakfast.   metFORMIN (GLUCOPHAGE-XR) 500 MG 24 hr tablet Take 1,000 mg by mouth 2 (two) times daily with a meal.   multivitamin-iron-minerals-folic acid (CENTRUM) chewable tablet Chew 2 tablets by mouth 2 (two) times daily.   rosuvastatin  (CRESTOR ) 5 MG tablet Take 1 tablet (5 mg total) by mouth daily.   saw palmetto 160 MG capsule Take 160 mg by mouth 2 (two) times daily.   sertraline (ZOLOFT) 100 MG tablet Take 100 mg  by mouth daily.     Allergies:   Atorvastatin and Doxycycline   Social History   Socioeconomic History   Marital status: Married    Spouse name: Not on file   Number of children: Not on file   Years of education: Not on file   Highest education level: Not on file  Occupational History   Not on file  Tobacco Use   Smoking status: Former    Types: Cigarettes    Passive exposure: Past   Smokeless tobacco: Never  Vaping Use   Vaping status: Never Used  Substance and Sexual Activity   Alcohol use: Not Currently   Drug use: Not Currently   Sexual activity: Yes  Other Topics Concern   Not on file  Social History Narrative   Not on file   Social Drivers of Health   Financial Resource Strain: Not on file  Food Insecurity: Not on file  Transportation Needs: Not on file  Physical Activity: Not on file  Stress: Not on file  Social Connections: Not on file  Family History: The patient's family history includes COPD in his father; Cancer in his sister; Heart failure in his father; Stroke in his father. ROS:   Please see the history of present illness.    All 14 point review of systems negative except as described per history of present illness.  EKGs/Labs/Other Studies Reviewed:    The following studies were reviewed today:   EKG:       Recent Labs: 11/12/2023: BUN 15; Creatinine, Ser 1.10; Potassium 3.8; Sodium 141  Recent Lipid Panel No results found for: "CHOL", "TRIG", "HDL", "CHOLHDL", "VLDL", "LDLCALC", "LDLDIRECT"  Physical Exam:    VS:  BP (!) 162/94   Pulse (!) 114   Ht 5\' 10"  (1.778 m)   Wt 296 lb 12.8 oz (134.6 kg)   PF 95 L/min   BMI 42.59 kg/m     Wt Readings from Last 3 Encounters:  12/07/23 296 lb 12.8 oz (134.6 kg)  10/12/23 (!) 300 lb 6.4 oz (136.3 kg)     GENERAL:  Well nourished, well developed in no acute distress NECK: No JVD; No carotid bruits CARDIAC: RRR, S1 and S2 present, no murmurs, no rubs, no gallops CHEST:  Clear to  auscultation without rales, wheezing or rhonchi  Extremities: Mild bilateral pitting pedal edema up to the knees.  Pulses bilaterally symmetric with radial 2+ and dorsalis pedis 1 + NEUROLOGIC:  Alert and oriented x 3  Medication Adjustments/Labs and Tests Ordered: Current medicines are reviewed at length with the patient today.  Concerns regarding medicines are outlined above.  Orders Placed This Encounter  Procedures   Basic Metabolic Panel (BMET)   Magnesium   No orders of the defined types were placed in this encounter.   Signed, Keajah Killough reddy Alden Bensinger, MD, MPH, [facility]. 12/07/2023 3:17 PM    [facility]

## 2023-12-07 NOTE — Assessment & Plan Note (Signed)
 Chronic diastolic heart failure, appears hypervolemic.  Continue furosemide  increase the dose to 80 mg in the morning and 40 mg in the afternoon.  Weight currently 296.8 pounds today. Advised to continue monitoring daily weights.  Repeat blood work BMP and magnesium tentatively in 1 week with higher doses of furosemide . Continue with amlodipine 5 mg once daily After blood work review next week we will consider starting SGLT2 inhibitors and losartan.

## 2023-12-18 DIAGNOSIS — I5032 Chronic diastolic (congestive) heart failure: Secondary | ICD-10-CM | POA: Diagnosis not present

## 2023-12-19 LAB — BASIC METABOLIC PANEL WITH GFR
BUN/Creatinine Ratio: 13 (ref 10–24)
BUN: 15 mg/dL (ref 8–27)
CO2: 25 mmol/L (ref 20–29)
Calcium: 9.8 mg/dL (ref 8.6–10.2)
Chloride: 94 mmol/L — ABNORMAL LOW (ref 96–106)
Creatinine, Ser: 1.14 mg/dL (ref 0.76–1.27)
Glucose: 207 mg/dL — ABNORMAL HIGH (ref 70–99)
Potassium: 3.5 mmol/L (ref 3.5–5.2)
Sodium: 140 mmol/L (ref 134–144)
eGFR: 66 mL/min/{1.73_m2} (ref >59)

## 2023-12-19 LAB — MAGNESIUM: Magnesium: 1.5 mg/dL — ABNORMAL LOW (ref 1.6–2.3)

## 2023-12-23 ENCOUNTER — Ambulatory Visit

## 2023-12-24 ENCOUNTER — Other Ambulatory Visit: Payer: Self-pay

## 2023-12-24 ENCOUNTER — Telehealth: Payer: Self-pay

## 2023-12-24 NOTE — Telephone Encounter (Signed)
 Pt c/o medication issue:  1. Name of Medication: furosemide  (LASIX ) 40 MG tablet   2. How are you currently taking this medication (dosage and times per day)? As written   3. Are you having a reaction (difficulty breathing--STAT)? no  4. What is your medication issue? Doria with [facility] called in asking to clarify pt instructions on this medication.

## 2023-12-24 NOTE — Telephone Encounter (Signed)
 Called Doria at [facility] and clarified the patient's Lasix  prescription. Doria verbalized understanding what the correct dosage is and had no further questions at this time.

## 2024-01-05 ENCOUNTER — Ambulatory Visit

## 2024-01-18 DIAGNOSIS — I6523 Occlusion and stenosis of bilateral carotid arteries: Secondary | ICD-10-CM | POA: Diagnosis not present

## 2024-01-18 DIAGNOSIS — Z794 Long term (current) use of insulin: Secondary | ICD-10-CM | POA: Diagnosis not present

## 2024-01-18 DIAGNOSIS — Z947 Corneal transplant status: Secondary | ICD-10-CM | POA: Diagnosis not present

## 2024-01-18 DIAGNOSIS — N401 Enlarged prostate with lower urinary tract symptoms: Secondary | ICD-10-CM | POA: Diagnosis not present

## 2024-01-18 DIAGNOSIS — I700 Atherosclerosis of aorta: Secondary | ICD-10-CM | POA: Diagnosis not present

## 2024-01-18 DIAGNOSIS — I5032 Chronic diastolic (congestive) heart failure: Secondary | ICD-10-CM | POA: Diagnosis not present

## 2024-01-18 DIAGNOSIS — Z1331 Encounter for screening for depression: Secondary | ICD-10-CM | POA: Diagnosis not present

## 2024-01-18 DIAGNOSIS — Z0000 Encounter for general adult medical examination without abnormal findings: Secondary | ICD-10-CM | POA: Diagnosis not present

## 2024-01-18 DIAGNOSIS — R413 Other amnesia: Secondary | ICD-10-CM | POA: Diagnosis not present

## 2024-01-18 DIAGNOSIS — N1831 Chronic kidney disease, stage 3a: Secondary | ICD-10-CM | POA: Diagnosis not present

## 2024-01-18 DIAGNOSIS — Z1339 Encounter for screening examination for other mental health and behavioral disorders: Secondary | ICD-10-CM | POA: Diagnosis not present

## 2024-01-18 DIAGNOSIS — I130 Hypertensive heart and chronic kidney disease with heart failure and stage 1 through stage 4 chronic kidney disease, or unspecified chronic kidney disease: Secondary | ICD-10-CM | POA: Diagnosis not present

## 2024-01-18 DIAGNOSIS — E1140 Type 2 diabetes mellitus with diabetic neuropathy, unspecified: Secondary | ICD-10-CM | POA: Diagnosis not present

## 2024-01-18 DIAGNOSIS — G4730 Sleep apnea, unspecified: Secondary | ICD-10-CM | POA: Diagnosis not present

## 2024-01-18 DIAGNOSIS — C439 Malignant melanoma of skin, unspecified: Secondary | ICD-10-CM | POA: Diagnosis not present

## 2024-01-18 DIAGNOSIS — E785 Hyperlipidemia, unspecified: Secondary | ICD-10-CM | POA: Diagnosis not present

## 2024-01-20 ENCOUNTER — Ambulatory Visit

## 2024-01-20 VITALS — BP 152/70 | HR 114 | Ht 70.0 in | Wt 293.4 lb

## 2024-01-20 DIAGNOSIS — E782 Mixed hyperlipidemia: Secondary | ICD-10-CM | POA: Diagnosis not present

## 2024-01-20 DIAGNOSIS — I5032 Chronic diastolic (congestive) heart failure: Secondary | ICD-10-CM | POA: Diagnosis not present

## 2024-01-20 DIAGNOSIS — I10 Essential (primary) hypertension: Secondary | ICD-10-CM | POA: Diagnosis not present

## 2024-01-20 MED ORDER — AMLODIPINE BESYLATE 10 MG PO TABS: 10.0000 mg | ORAL_TABLET | Freq: Every day | ORAL | 3 refills | Status: AC

## 2024-01-20 MED ORDER — MAGNESIUM OXIDE 400 MG PO TABS: 400.0000 mg | ORAL_TABLET | Freq: Every day | ORAL | 3 refills | Status: AC

## 2024-01-20 NOTE — Assessment & Plan Note (Signed)
 Weight today 293 pounds. Appears compensated.  Continue furosemide  80 mg in the morning and 40 mg in the afternoon. Advised to continue monitoring daily weights. Keep salt intake below 2 g/day, they have now switched to low-salt diet.  Labs with low magnesium levels recently 12-18-2023. Advised to start taking magnesium supplements over-the-counter magnesium oxide 400 mg once daily.  Continue amlodipine for blood pressure control, titrate up the dose to 10 mg once daily. Not a candidate for SGLT2 inhibitors at this time due to skinfold yeast infection in the groins and in the chest. He prefers to hold off on adding more medications and hence will titrate up the dose of amlodipine as above in place of adding losartan.

## 2024-01-20 NOTE — Progress Notes (Signed)
 Cardiology Consultation:    Date:  01/20/2024   ID:  Corey Fitzgerald, DOB 05/10/1946, MRN [id number]  PCP:  Corey Senior, MD  Cardiologist:  Corey SAUNDERS Aleynah Rocchio, MD   Referring MD: Corey Senior, MD   No chief complaint on file.    ASSESSMENT AND PLAN:   Corey Fitzgerald 78 year old male with history of chronic diastolic CHF, morbid obesity, diabetes mellitus, CKD stage II, carotid atherosclerosis on ultrasound May 2023, hypertension, hyperlipidemia, statin intolerance due to muscle aches, OSA-did not tolerate CPAP, former tobacco use, chronic lumbar pain secondary to spinal stenosis, bilateral lower extremity venous insufficiency, chronic sinus tachycardia. Here for a follow-up visit. Problem List Items Addressed This Visit     Diastolic CHF, chronic (HCC) - Primary   Weight today 293 pounds. Appears compensated.  Continue furosemide  80 mg in the morning and 40 mg in the afternoon. Advised to continue monitoring daily weights. Keep salt intake below 2 g/day, they have now switched to low-salt diet.  Labs with low magnesium levels recently 12-18-2023. Advised to start taking magnesium supplements over-the-counter magnesium oxide 400 mg once daily.  Continue amlodipine for blood pressure control, titrate up the dose to 10 mg once daily. Not a candidate for SGLT2 inhibitors at this time due to skinfold yeast infection in the groins and in the chest. He prefers to hold off on adding more medications and hence will titrate up the dose of amlodipine as above in place of adding losartan.       Relevant Medications   tadalafil (CIALIS) 20 MG tablet   Essential (primary) hypertension   Suboptimal blood pressure control. Titrate up amlodipine to 10 mg once daily. Target blood pressure below 130 over 80 mmHg.       Relevant Medications   tadalafil (CIALIS) 20 MG tablet   Hyperlipidemia   Last lipid panel is from February 2025.  LDL 101, HDL 39, triglycerides 150 and total  cholesterol 170. We have attempted statin therapy with rosuvastatin  and has been tolerating it well.  Continue with current therapy, will reassess CMP and lipid panel at next follow-up visit in 6 months.      Relevant Medications   tadalafil (CIALIS) 20 MG tablet   Return to clinic in 6 months.   History of Present Illness:    Corey Fitzgerald is a 78 y.o. male who is being seen today for follow-up visit. PCP is Corey Senior, MD. Last visit with me in the office was 12-07-2023.  Pleasant man here for the visit today accompanied by his wife.  Has history of chronic diastolic CHF, morbid obesity, diabetes mellitus, CKD stage II, carotid atherosclerosis on ultrasound May 2023, hypertension, hyperlipidemia, statin intolerance due to muscle aches, OSA-did not tolerate CPAP, former tobacco use, chronic lumbar pain secondary to spinal stenosis, bilateral lower extremity venous insufficiency, chronic sinus tachycardia.  Echocardiogram from 11/12/2023 noted normal biventricular function LVEF 60 to 65%, grade 1 diastolic dysfunction, aortic root size 4 cm, within normal limits considering his age and body surface area.  Weight at last visit 296.8 pounds today 293.4 pounds.  Blood work from 12-18-2023 noted BUN 15, creatinine 1.14, EGFR 66 Sodium 140 and potassium 3.5. Magnesium 1.5.  Mentions overall has been doing well.  Weight in fact was down to 89 pounds last week. They have been diligent with salt restriction in the diet. Feel that the higher dose of Lasix  has helped.  Does get short of breath after walking or doing a moderate amount of activity.  Relieved with rest. Denies any chest pain or symptoms of shortness of breath at rest. Was reclined in his chair.  Has been recommended to ambulate using a cane. Has a motorized scooter available for him for the last week, has not been using it yet.  Continues to walk short distances.    Past Medical History:  Diagnosis Date    Abnormal results of liver function studies    Acquired cerebral atrophy (HCC)    Aortic aneurysm (HCC) 12/03/2023   Dec 01, 2023 Entered By: Corey Fitzgerald Comment: 40  mm     Arthritis    Atherosclerosis of aorta (HCC)    Benign prostatic hyperplasia with lower urinary tract symptoms    Bilateral carotid artery stenosis    Cancer (HCC)    Chronic kidney disease due to hypertension 08/08/2019   Chronic kidney disease, stage 3a (HCC)    Congestive heart failure (HCC) 12/03/2023   Corneal transplant status    Depression    Diabetes mellitus without complication (HCC)    Diastolic CHF, chronic (HCC)    Edema of lower extremity 12/03/2023   Dec 02, 2023 Entered By: Corey Fitzgerald Comment: B/L     Essential (primary) hypertension    Hyperlipidemia    Hypertension    Hypothyroidism 12/03/2023   Long term current use of insulin (HCC) 07/24/2017   Lower urinary tract symptoms due to benign prostatic hyperplasia 05/03/2012   Male erectile disorder    Malignant melanoma of skin (HCC)    Memory changes    Morbid (severe) obesity due to excess calories (HCC)    Obstructive sleep apnea of adult 12/03/2023   Dec 01, 2023 Entered By: Corey DOROTHE E Comment: cannot tolerate CPAP     Other specified counseling 12/07/2023   Sleep apnea    Testicular hypofunction    Type 2 diabetes mellitus with diabetic neuropathy, unspecified (HCC)    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   aspirin 81 MG chewable tablet Chew by mouth daily.   Continuous Glucose Sensor (FREESTYLE LIBRE 2 PLUS SENSOR) MISC 1 each by Other route as directed.   furosemide  (LASIX ) 40 MG tablet Take 1 tablet (40 mg total) by mouth daily. Take 80 mg in the AM and 40 mg in the PM   glucosamine-chondroitin 500-400 MG  tablet Take 2 tablets by mouth daily.   insulin aspart (NOVOLOG) 100 UNIT/ML injection Inject 20-25 Units into the skin 3 (three) times daily before meals.   insulin glargine (LANTUS) 100 UNIT/ML injection Inject 80 Units into the skin in the morning. And takes 50 units subcutaneous twice a day in the evening.   levothyroxine (SYNTHROID) 75 MCG tablet Take 75 mcg by mouth daily before breakfast.   metFORMIN (GLUCOPHAGE-XR) 500 MG 24 hr tablet Take 1,000 mg by mouth 2 (two) times daily with a meal.   multivitamin-iron-minerals-folic acid (CENTRUM) chewable tablet Chew 2 tablets by mouth 2 (two) times daily.   prednisoLONE acetate (PRED FORTE) 1 % ophthalmic suspension Place 1 drop into both eyes daily at 6 (six) AM.   rosuvastatin  (CRESTOR ) 5 MG tablet Take 1 tablet (5 mg total) by mouth daily.   saw palmetto 160 MG capsule Take 160 mg by mouth 2 (two) times daily.   sertraline (  ZOLOFT) 100 MG tablet Take 100 mg by mouth daily.   tadalafil (CIALIS) 20 MG tablet Take 20 mg by mouth daily as needed.     Allergies:   Atorvastatin and Doxycycline   Social History   Socioeconomic History   Marital status: Married    Spouse name: Not on file   Number of children: Not on file   Years of education: Not on file   Highest education level: Not on file  Occupational History   Not on file  Tobacco Use   Smoking status: Former    Types: Cigarettes    Passive exposure: Past   Smokeless tobacco: Never  Vaping Use   Vaping status: Never Used  Substance and Sexual Activity   Alcohol use: Not Currently   Drug use: Not Currently   Sexual activity: Yes  Other Topics Concern   Not on file  Social History Narrative   Not on file   Social Drivers of Health   Financial Resource Strain: Not on file  Food Insecurity: Not on file  Transportation Needs: Not on file  Physical Activity: Not on file  Stress: Not on file  Social Connections: Not on file     Family History: The patient's family  history includes COPD in his father; Cancer in his sister; Heart failure in his father; Stroke in his father. ROS:   Please see the history of present illness.    All 14 point review of systems negative except as described per history of present illness.  EKGs/Labs/Other Studies Reviewed:    The following studies were reviewed today:   EKG:       Recent Labs: 12/18/2023: BUN 15; Creatinine, Ser 1.14; Magnesium 1.5; Potassium 3.5; Sodium 140  Recent Lipid Panel No results found for: CHOL, TRIG, HDL, CHOLHDL, VLDL, LDLCALC, LDLDIRECT  Physical Exam:    VS:  BP (!) 152/70 (BP Location: Right Arm)   Pulse (!) 114   Ht 5' 10 (1.778 m)   Wt 293 lb 6.4 oz (133.1 kg)   SpO2 94%   BMI 42.10 kg/m     Wt Readings from Last 3 Encounters:  01/20/24 293 lb 6.4 oz (133.1 kg)  12/07/23 296 lb 12.8 oz (134.6 kg)  10/12/23 (!) 300 lb 6.4 oz (136.3 kg)     GENERAL:  Well nourished, well developed in no acute distress NECK: No JVD; No carotid bruits CARDIAC: RRR, S1 and S2 present, no murmurs, no rubs, no gallops CHEST:  Clear to auscultation without rales, wheezing or rhonchi  Extremities: 1+ bilateral pitting pedal edema. Pulses bilaterally symmetric with radial 2+ and dorsalis pedis 2+.  Wound dressing on left lower extremity shin and calf. NEUROLOGIC:  Alert and oriented x 3  Medication Adjustments/Labs and Tests Ordered: Current medicines are reviewed at length with the patient today.  Concerns regarding medicines are outlined above.  No orders of the defined types were placed in this encounter.  No orders of the defined types were placed in this encounter.   Signed, Corey jess Kobus, MD, MPH, [facility]. 01/20/2024 3:59 PM    [facility]

## 2024-01-20 NOTE — Assessment & Plan Note (Signed)
 Suboptimal blood pressure control. Titrate up amlodipine to 10 mg once daily. Target blood pressure below 130 over 80 mmHg.

## 2024-01-20 NOTE — Assessment & Plan Note (Signed)
 Last lipid panel is from February 2025.  LDL 101, HDL 39, triglycerides 150 and total cholesterol 170. We have attempted statin therapy with rosuvastatin  and has been tolerating it well.  Continue with current therapy, will reassess CMP and lipid panel at next follow-up visit in 6 months.

## 2024-01-20 NOTE — Patient Instructions (Signed)
 Medication Instructions:  Your physician has recommended you make the following change in your medication:   Increase your Amlodipine to 10 mg daily.  Start taking over the counter Magnesium Oxide 400 mg daily.  *If you need a refill on your cardiac medications before your next appointment, please call your pharmacy*   Lab Work: None ordered If you have labs (blood work) drawn today and your tests are completely normal, you will receive your results only by: MyChart Message (if you have MyChart) OR A paper copy in the mail If you have any lab test that is abnormal or we need to change your treatment, we will call you to review the results.   Testing/Procedures: None ordered   Follow-Up: At [facility], you and your health needs are our priority.  As part of our continuing mission to provide you with exceptional heart care, we have created designated Provider Care Teams.  These Care Teams include your primary Cardiologist (physician) and Advanced Practice Providers (APPs -  Physician Assistants and Nurse Practitioners) who all work together to provide you with the care you need, when you need it.  We recommend signing up for the patient portal called MyChart.  Sign up information is provided on this After Visit Summary.  MyChart is used to connect with patients for Virtual Visits (Telemedicine).  Patients are able to view lab/test results, encounter notes, upcoming appointments, etc.  Non-urgent messages can be sent to your provider as well.   To learn more about what you can do with MyChart, go to [email or web address].    Your next appointment:   6 month(s)  The format for your next appointment:   In Person  Provider:   Alean Kobus, MD    Other Instructions none  Important Information About Sugar

## 2024-02-24 ENCOUNTER — Other Ambulatory Visit: Payer: Self-pay

## 2024-02-24 DIAGNOSIS — S41109A Unspecified open wound of unspecified upper arm, initial encounter: Secondary | ICD-10-CM

## 2024-03-15 ENCOUNTER — Encounter (HOSPITAL_BASED_OUTPATIENT_CLINIC_OR_DEPARTMENT_OTHER): Attending: General Surgery | Admitting: General Surgery

## 2024-03-15 DIAGNOSIS — E11622 Type 2 diabetes mellitus with other skin ulcer: Secondary | ICD-10-CM | POA: Insufficient documentation

## 2024-03-15 DIAGNOSIS — L97822 Non-pressure chronic ulcer of other part of left lower leg with fat layer exposed: Secondary | ICD-10-CM | POA: Insufficient documentation

## 2024-03-15 DIAGNOSIS — L97819 Non-pressure chronic ulcer of other part of right lower leg with unspecified severity: Secondary | ICD-10-CM | POA: Diagnosis present

## 2024-03-15 DIAGNOSIS — I5032 Chronic diastolic (congestive) heart failure: Secondary | ICD-10-CM | POA: Diagnosis not present

## 2024-03-15 DIAGNOSIS — L97812 Non-pressure chronic ulcer of other part of right lower leg with fat layer exposed: Secondary | ICD-10-CM | POA: Diagnosis not present

## 2024-03-15 DIAGNOSIS — I872 Venous insufficiency (chronic) (peripheral): Secondary | ICD-10-CM | POA: Insufficient documentation

## 2024-03-22 ENCOUNTER — Encounter (HOSPITAL_BASED_OUTPATIENT_CLINIC_OR_DEPARTMENT_OTHER): Admitting: General Surgery

## 2024-03-23 ENCOUNTER — Encounter (HOSPITAL_BASED_OUTPATIENT_CLINIC_OR_DEPARTMENT_OTHER): Attending: General Surgery | Admitting: General Surgery

## 2024-03-23 DIAGNOSIS — L97829 Non-pressure chronic ulcer of other part of left lower leg with unspecified severity: Secondary | ICD-10-CM | POA: Diagnosis not present

## 2024-03-23 DIAGNOSIS — I890 Lymphedema, not elsewhere classified: Secondary | ICD-10-CM | POA: Diagnosis not present

## 2024-03-23 DIAGNOSIS — E11622 Type 2 diabetes mellitus with other skin ulcer: Secondary | ICD-10-CM | POA: Insufficient documentation

## 2024-03-23 DIAGNOSIS — I5032 Chronic diastolic (congestive) heart failure: Secondary | ICD-10-CM | POA: Diagnosis not present

## 2024-03-23 DIAGNOSIS — E1151 Type 2 diabetes mellitus with diabetic peripheral angiopathy without gangrene: Secondary | ICD-10-CM | POA: Diagnosis not present

## 2024-03-23 DIAGNOSIS — L97819 Non-pressure chronic ulcer of other part of right lower leg with unspecified severity: Secondary | ICD-10-CM | POA: Insufficient documentation

## 2024-03-23 DIAGNOSIS — I872 Venous insufficiency (chronic) (peripheral): Secondary | ICD-10-CM | POA: Diagnosis not present

## 2024-03-23 DIAGNOSIS — L97812 Non-pressure chronic ulcer of other part of right lower leg with fat layer exposed: Secondary | ICD-10-CM | POA: Diagnosis not present

## 2024-03-25 ENCOUNTER — Ambulatory Visit (HOSPITAL_BASED_OUTPATIENT_CLINIC_OR_DEPARTMENT_OTHER): Admitting: General Surgery

## 2024-03-31 ENCOUNTER — Encounter (HOSPITAL_BASED_OUTPATIENT_CLINIC_OR_DEPARTMENT_OTHER): Admitting: General Surgery

## 2024-03-31 DIAGNOSIS — I890 Lymphedema, not elsewhere classified: Secondary | ICD-10-CM | POA: Diagnosis not present

## 2024-03-31 DIAGNOSIS — E11622 Type 2 diabetes mellitus with other skin ulcer: Secondary | ICD-10-CM | POA: Diagnosis not present

## 2024-03-31 DIAGNOSIS — L97821 Non-pressure chronic ulcer of other part of left lower leg limited to breakdown of skin: Secondary | ICD-10-CM | POA: Diagnosis not present

## 2024-03-31 DIAGNOSIS — I872 Venous insufficiency (chronic) (peripheral): Secondary | ICD-10-CM | POA: Diagnosis not present

## 2024-03-31 DIAGNOSIS — L97811 Non-pressure chronic ulcer of other part of right lower leg limited to breakdown of skin: Secondary | ICD-10-CM | POA: Diagnosis not present

## 2024-04-03 ENCOUNTER — Other Ambulatory Visit: Payer: Self-pay

## 2024-04-04 ENCOUNTER — Encounter (HOSPITAL_BASED_OUTPATIENT_CLINIC_OR_DEPARTMENT_OTHER): Admitting: General Surgery

## 2024-04-04 DIAGNOSIS — I890 Lymphedema, not elsewhere classified: Secondary | ICD-10-CM | POA: Diagnosis not present

## 2024-04-07 ENCOUNTER — Encounter (HOSPITAL_BASED_OUTPATIENT_CLINIC_OR_DEPARTMENT_OTHER): Admitting: General Surgery

## 2024-04-07 DIAGNOSIS — I872 Venous insufficiency (chronic) (peripheral): Secondary | ICD-10-CM | POA: Diagnosis not present

## 2024-04-07 DIAGNOSIS — I890 Lymphedema, not elsewhere classified: Secondary | ICD-10-CM | POA: Diagnosis not present

## 2024-04-07 DIAGNOSIS — L97821 Non-pressure chronic ulcer of other part of left lower leg limited to breakdown of skin: Secondary | ICD-10-CM | POA: Diagnosis not present

## 2024-04-07 DIAGNOSIS — E11622 Type 2 diabetes mellitus with other skin ulcer: Secondary | ICD-10-CM | POA: Diagnosis not present

## 2024-04-14 ENCOUNTER — Ambulatory Visit (HOSPITAL_BASED_OUTPATIENT_CLINIC_OR_DEPARTMENT_OTHER): Admitting: General Surgery

## 2024-04-21 DIAGNOSIS — I130 Hypertensive heart and chronic kidney disease with heart failure and stage 1 through stage 4 chronic kidney disease, or unspecified chronic kidney disease: Secondary | ICD-10-CM | POA: Diagnosis not present

## 2024-04-21 DIAGNOSIS — N1831 Chronic kidney disease, stage 3a: Secondary | ICD-10-CM | POA: Diagnosis not present

## 2024-04-21 DIAGNOSIS — I5032 Chronic diastolic (congestive) heart failure: Secondary | ICD-10-CM | POA: Diagnosis not present

## 2024-04-21 DIAGNOSIS — E785 Hyperlipidemia, unspecified: Secondary | ICD-10-CM | POA: Diagnosis not present

## 2024-04-21 DIAGNOSIS — Z794 Long term (current) use of insulin: Secondary | ICD-10-CM | POA: Diagnosis not present

## 2024-04-21 DIAGNOSIS — I129 Hypertensive chronic kidney disease with stage 1 through stage 4 chronic kidney disease, or unspecified chronic kidney disease: Secondary | ICD-10-CM | POA: Diagnosis not present

## 2024-04-21 DIAGNOSIS — E1140 Type 2 diabetes mellitus with diabetic neuropathy, unspecified: Secondary | ICD-10-CM | POA: Diagnosis not present

## 2024-04-21 DIAGNOSIS — Z7189 Other specified counseling: Secondary | ICD-10-CM | POA: Diagnosis not present

## 2024-04-21 DIAGNOSIS — R945 Abnormal results of liver function studies: Secondary | ICD-10-CM | POA: Diagnosis not present

## 2024-05-20 ENCOUNTER — Telehealth: Payer: Self-pay

## 2024-05-20 NOTE — Telephone Encounter (Signed)
 Spouse called stating that patient had a rash under his skin folds under his breasts and abdomen. She was asking for medication and prescription powder. Encouraged her to call patients PCP for treatment of rash. She verbalized understanding and had no further questions.

## 2024-05-20 NOTE — Telephone Encounter (Signed)
 Spoke with spouse. Advised to consult PCP for treatment of rash.

## 2024-05-20 NOTE — Telephone Encounter (Signed)
 Pt spouse called in asking if Dr. Liborio can treat his yeast infections. Also sent a mychart message.

## 2024-07-01 ENCOUNTER — Ambulatory Visit

## 2024-07-27 ENCOUNTER — Ambulatory Visit

## 2024-08-09 ENCOUNTER — Ambulatory Visit

## 2024-08-12 ENCOUNTER — Ambulatory Visit

## 2024-08-18 ENCOUNTER — Ambulatory Visit

## 2024-08-25 ENCOUNTER — Ambulatory Visit

## 2024-08-25 VITALS — BP 140/60 | HR 104 | Ht 70.0 in | Wt 299.0 lb

## 2024-08-25 DIAGNOSIS — I5032 Chronic diastolic (congestive) heart failure: Secondary | ICD-10-CM | POA: Diagnosis not present

## 2024-08-25 DIAGNOSIS — I10 Essential (primary) hypertension: Secondary | ICD-10-CM

## 2024-08-25 DIAGNOSIS — E782 Mixed hyperlipidemia: Secondary | ICD-10-CM | POA: Diagnosis not present

## 2024-08-25 NOTE — Assessment & Plan Note (Signed)
 Weight increased currently 299 pounds. Appears compensated. Mildly hypervolemic.  Continue furosemide  80 mg in the morning and 40 mg in the afternoon. Strict restriction of sodium below 2 g/day recommended. If weight goes up by 2 pounds in a day recommended he take 80 mg twice daily.  Prefers to hold off on escalating medications at this time. Continue amlodipine  10 mg once daily. Will hold off on initiating ACE inhibitor/ARB/Entresto as per his preference. Not a candidate for SGL T2 inhibitors given frequent skin fold yeast infections.

## 2024-08-25 NOTE — Assessment & Plan Note (Signed)
 Last lipid panel from 04/21/2024 total cholesterol 131, triglycerides 221, HDL 37 and LDL 50.  Well-controlled except for triglycerides.  Continue rosuvastatin  5 mg once daily. Recommended to cut down on fatty food content and increase protein content in the diet.

## 2024-08-25 NOTE — Patient Instructions (Addendum)
 Medication Instructions:  Your physician recommends that you continue on your current medications as directed. Please refer to the Current Medication list given to you today.  *If you need a refill on your cardiac medications before your next appointment, please call your pharmacy*   Lab Work: None Ordered If you have labs (blood work) drawn today and your tests are completely normal, you will receive your results only by: MyChart Message (if you have MyChart) OR A paper copy in the mail If you have any lab test that is abnormal or we need to change your treatment, we will call you to review the results.   Testing/Procedures: None Ordered   Follow-Up: At [facility], you and your health needs are our priority.  As part of our continuing mission to provide you with exceptional heart care, we have created designated Provider Care Teams.  These Care Teams include your primary Cardiologist (physician) and Advanced Practice Providers (APPs -  Physician Assistants and Nurse Practitioners) who all work together to provide you with the care you need, when you need it.  We recommend signing up for the patient portal called MyChart.  Sign up information is provided on this After Visit Summary.  MyChart is used to connect with patients for Virtual Visits (Telemedicine).  Patients are able to view lab/test results, encounter notes, upcoming appointments, etc.  Non-urgent messages can be sent to your provider as well.   To learn more about what you can do with MyChart, go to [email or web address].    Your next appointment:   9 month(s)  The format for your next appointment:   In Person  Provider:   Alean Kobus, MD   Other Instructions NA

## 2024-08-25 NOTE — Assessment & Plan Note (Signed)
 Near optimal. Continue amlodipine  10 mg once daily. Consider starting Entresto however he prefers to hold off on adding new medications at this time.

## 2024-08-25 NOTE — Progress Notes (Signed)
 "  Cardiology Consultation:    Date:  08/25/2024   ID:  Corey Fitzgerald, DOB 11/16/1945, MRN [id number]  PCP:  Nichole Senior, MD  Cardiologist:  Alean SAUNDERS Calaya Gildner, MD   Referring MD: Nichole Senior, MD   Chief Complaint  Patient presents with   Follow-up     ASSESSMENT AND PLAN:   [name] 79 year old male history of chronic diastolic CHF, morbid obesity, diabetes mellitus, CKD stage II, carotid atherosclerosis on ultrasound May 2023, hypertension, hyperlipidemia, statin intolerance due to muscle aches, OSA-did not tolerate CPAP, former tobacco use, chronic lumbar pain secondary to spinal stenosis, bilateral lower extremity venous insufficiency, chronic sinus tachycardia.  Instances of skin fold yeast infection.  Echocardiogram from 11/12/2023 noted normal biventricular function LVEF 60 to 65%, grade 1 diastolic dysfunction, aortic root size 4 cm, within normal limits considering his age and body surface area.    Here for follow-up visit Problem List Items Addressed This Visit       Cardiovascular and Mediastinum   Diastolic CHF, chronic (HCC) - Primary   Weight increased currently 299 pounds. Appears compensated. Mildly hypervolemic.  Continue furosemide  80 mg in the morning and 40 mg in the afternoon. Strict restriction of sodium below 2 g/day recommended. If weight goes up by 2 pounds in a day recommended he take 80 mg twice daily.  Prefers to hold off on escalating medications at this time. Continue amlodipine  10 mg once daily. Will hold off on initiating ACE inhibitor/ARB/Entresto as per his preference. Not a candidate for SGL T2 inhibitors given frequent skin fold yeast infections.      Hypertension   Near optimal. Continue amlodipine  10 mg once daily. Consider starting Entresto however he prefers to hold off on adding new medications at this time.         Other   Hyperlipidemia   Last lipid panel from 04/21/2024 total cholesterol 131, triglycerides  221, HDL 37 and LDL 50.  Well-controlled except for triglycerides.  Continue rosuvastatin  5 mg once daily. Recommended to cut down on fatty food content and increase protein content in the diet.        Return to clinic in 9 to 10 months.   History of Present Illness:    Corey Fitzgerald is a 79 y.o. male who is being seen today for follow-up visit. PCP is Nichole Senior, MD.  Also has a PCP through the [facility]. Last visit with me in the office was 01/20/2024.  Pleasant man here for the visit today accompanied by his wife.  Uses a cane to ambulate.  Has history of chronic diastolic CHF, morbid obesity, diabetes mellitus, CKD stage II, carotid atherosclerosis on ultrasound May 2023, hypertension, hyperlipidemia, statin intolerance due to muscle aches, OSA-did not tolerate CPAP, former tobacco use, chronic lumbar pain secondary to spinal stenosis, bilateral lower extremity venous insufficiency, chronic sinus tachycardia.  Instances of skin fold yeast infection.  Echocardiogram from 11/12/2023 noted normal biventricular function LVEF 60 to 65%, grade 1 diastolic dysfunction, aortic root size 4 cm, within normal limits considering his age and body surface area.   Last lipid panel to review is from 12/01/2023 total cholesterol 110, triglycerides 90, HDL 38 and LDL 60  Blood work from PCPs office 04/21/2024 BUN 13, creatinine 1, eGFR 72 Sodium 143, potassium 3.7 Normal transaminases and alkaline phosphatase Total cholesterol 131, triglycerides 221, HDL 37 and LDL 50.  Well-controlled except for triglycerides. Hemoglobin A1c 8.4.  Mentions things are relatively stable and at his baseline. Denies  any significant symptoms of chest pain, shortness of breath. Denies any orthopnea. Does have significant postnasal drip and has to clear his upper airways constantly.  Mentions this has been chronic. Denies any palpitations, lightheadedness, dizziness or syncopal episodes. No falls. No blood in urine  or stools. Has bilateral lower extremity edema, chronic.  Has consistently used leg wraps and compression socks.  In the process of obtaining new compression socks from the [facility].  Good compliance with his medications.  His wife helps manage his medications.  Mentions dietary salt restricted at home.  However at times does sneak in a snack and uses salt substitutes.  Past Medical History:  Diagnosis Date   Abnormal results of liver function studies    Acquired cerebral atrophy    Aortic aneurysm 12/03/2023   Dec 01, 2023 Entered By: JAYSON DOROTHE BRAVO Comment: 40  mm     Arthritis    Atherosclerosis of aorta    Benign prostatic hyperplasia with lower urinary tract symptoms    Bilateral carotid artery stenosis    Cancer (HCC)    Chronic kidney disease due to hypertension 08/08/2019   Chronic kidney disease, stage 3a (HCC)    Congestive heart failure (HCC) 12/03/2023   Corneal transplant status    Depression    Diabetes mellitus without complication (HCC)    Diastolic CHF, chronic (HCC)    Edema of lower extremity 12/03/2023   Dec 02, 2023 Entered By: JAYSON DOROTHE BRAVO Comment: B/L     Essential (primary) hypertension    Hyperlipidemia    Hypertension    Hypothyroidism 12/03/2023   Long term current use of insulin (HCC) 07/24/2017   Lower urinary tract symptoms due to benign prostatic hyperplasia 05/03/2012   Male erectile disorder    Malignant melanoma of skin (HCC)    Memory changes    Morbid (severe) obesity due to excess calories (HCC)    Obstructive sleep apnea of adult 12/03/2023   Dec 01, 2023 Entered By: JAYSON DOROTHE E Comment: cannot tolerate CPAP     Other specified counseling 12/07/2023   Sleep apnea    Testicular hypofunction    Type 2 diabetes mellitus with diabetic neuropathy, unspecified (HCC)    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     Past Surgical History:  Procedure Laterality Date   skin biposy      Current Medications: Active  Medications[1]   Allergies:   Atorvastatin and Doxycycline   Social History   Socioeconomic History   Marital status: Married    Spouse name: Not on file   Number of children: Not on file   Years of education: Not on file   Highest education level: Not on file  Occupational History   Not on file  Tobacco Use   Smoking status: Former    Types: Cigarettes    Passive exposure: Past   Smokeless tobacco: Never  Vaping Use   Vaping status: Never Used  Substance and Sexual Activity   Alcohol  use: Not Currently   Drug use: Not Currently   Sexual activity: Yes  Other Topics Concern   Not on file  Social History Narrative   Not on file   Social Drivers of Health   Tobacco Use: Medium Risk (08/25/2024)   Patient History    Smoking Tobacco Use: Former    Smokeless Tobacco Use: Never    Passive Exposure: Past  Physicist, Medical Strain: Not on file  Food Insecurity: Not on file  Transportation Needs: Not on file  Physical Activity: Not on file  Stress: Not on file  Social Connections: Not on file  Depression (EYV7-0): Not on file  Alcohol  Screen: Not on file  Housing: Not on file  Utilities: Not on file  Health Literacy: Not on file     Family History: The patient's family history includes COPD in his father; Cancer in his sister; Heart failure in his father; Stroke in his father. ROS:   Please see the history of present illness.    All 14 point review of systems negative except as described per history of present illness.  EKGs/Labs/Other Studies Reviewed:    The following studies were reviewed today:   EKG:       Recent Labs: 12/18/2023: BUN 15; Creatinine, Ser 1.14; Magnesium  1.5; Potassium 3.5; Sodium 140  Recent Lipid Panel No results found for: CHOL, TRIG, HDL, CHOLHDL, VLDL, LDLCALC, LDLDIRECT  Physical Exam:    VS:  BP (!) 140/60   Pulse (!) 104   Ht 5' 10 (1.778 m)   Wt 299 lb (135.6 kg)   SpO2 94%   BMI 42.90 kg/m     Wt Readings  from Last 3 Encounters:  08/25/24 299 lb (135.6 kg)  01/20/24 293 lb 6.4 oz (133.1 kg)  12/07/23 296 lb 12.8 oz (134.6 kg)     GENERAL:  Well nourished, well developed in no acute distress NECK: No JVD; No carotid bruits CARDIAC: RRR, S1 and S2 present, no murmurs, no rubs, no gallops CHEST:  Clear to auscultation without rales, wheezing or rhonchi  Extremities: 1+ bilateral pitting pedal edema. Pulses bilaterally symmetric with radial 2+ and dorsalis pedis 2+.  Wound dressing on left lower extremity shin and calf. NEUROLOGIC:  Alert and oriented x 3  Medication Adjustments/Labs and Tests Ordered: Current medicines are reviewed at length with the patient today.  Concerns regarding medicines are outlined above.  No orders of the defined types were placed in this encounter.  No orders of the defined types were placed in this encounter.   Signed, Kalieb Freeland reddy Carmelia Tiner, MD, MPH, [facility] 08/25/2024 3:53 PM    [facility]     [1]  Current Meds  Medication Sig   amLODipine  (NORVASC ) 10 MG tablet Take 1 tablet (10 mg total) by mouth daily.   aspirin 81 MG chewable tablet Chew by mouth daily.   Continuous Glucose Sensor (FREESTYLE LIBRE 2 PLUS SENSOR) MISC 1 each by Other route as directed.   furosemide  (LASIX ) 40 MG tablet TAKE 1 TABLET (40 MG TOTAL) BY MOUTH DAILY. TAKE 80 MG IN THE AM AND 40 MG IN THE PM   glucosamine-chondroitin 500-400 MG tablet Take 2 tablets by mouth daily.   insulin aspart (NOVOLOG) 100 UNIT/ML injection Inject 20-25 Units into the skin 3 (three) times daily before meals.   insulin glargine (LANTUS) 100 UNIT/ML injection Inject 80 Units into the skin in the morning. And takes 50 units subcutaneous twice a day in the evening.   levothyroxine (SYNTHROID) 75 MCG tablet Take 75 mcg by mouth daily before breakfast.   magnesium  oxide (MAG-OX) 400 MG tablet Take 1 tablet (400 mg total) by mouth daily.   metFORMIN (GLUCOPHAGE-XR) 500 MG 24 hr  tablet Take 1,000 mg by mouth 2 (two) times daily with a meal.   multivitamin-iron-minerals-folic acid (CENTRUM) chewable tablet Chew 2 tablets by mouth 2 (two) times daily.   prednisoLONE acetate (PRED FORTE) 1 % ophthalmic suspension Place 1 drop into both eyes daily at 6 (six)  AM.   rosuvastatin  (CRESTOR ) 5 MG tablet Take 1 tablet (5 mg total) by mouth daily.   sertraline (ZOLOFT) 100 MG tablet Take 100 mg by mouth daily.   tadalafil (CIALIS) 20 MG tablet Take 20 mg by mouth daily as needed.   "
